# Patient Record
Sex: Female | Born: 1980 | Race: White | Hispanic: No | Marital: Married | Smoking: Never smoker
Health system: Southern US, Community
[De-identification: ages and names within clinical notes are randomized; demographics above are authoritative.]

## PROBLEM LIST (undated history)

## (undated) DIAGNOSIS — R112 Nausea with vomiting, unspecified: Secondary | ICD-10-CM

## (undated) DIAGNOSIS — N390 Urinary tract infection, site not specified: Secondary | ICD-10-CM

## (undated) DIAGNOSIS — F419 Anxiety disorder, unspecified: Secondary | ICD-10-CM

## (undated) DIAGNOSIS — M1990 Unspecified osteoarthritis, unspecified site: Secondary | ICD-10-CM

## (undated) DIAGNOSIS — Z803 Family history of malignant neoplasm of breast: Secondary | ICD-10-CM

## (undated) DIAGNOSIS — T4145XA Adverse effect of unspecified anesthetic, initial encounter: Secondary | ICD-10-CM

## (undated) DIAGNOSIS — Z9889 Other specified postprocedural states: Secondary | ICD-10-CM

## (undated) DIAGNOSIS — T8859XA Other complications of anesthesia, initial encounter: Secondary | ICD-10-CM

## (undated) HISTORY — DX: Unspecified osteoarthritis, unspecified site: M19.90

## (undated) HISTORY — DX: Anxiety disorder, unspecified: F41.9

## (undated) HISTORY — DX: Family history of malignant neoplasm of breast: Z80.3

## (undated) HISTORY — DX: Urinary tract infection, site not specified: N39.0

---

## 2003-08-25 HISTORY — PX: TUBAL LIGATION: SHX77

## 2010-06-08 ENCOUNTER — Other Ambulatory Visit: Payer: Self-pay | Admitting: Internal Medicine

## 2010-06-08 ENCOUNTER — Ambulatory Visit: Payer: Self-pay

## 2013-01-28 ENCOUNTER — Ambulatory Visit: Payer: Self-pay | Admitting: General Practice

## 2013-02-18 ENCOUNTER — Encounter: Payer: Self-pay | Admitting: Podiatry

## 2013-02-18 ENCOUNTER — Ambulatory Visit (INDEPENDENT_AMBULATORY_CARE_PROVIDER_SITE_OTHER): Payer: BC Managed Care – PPO

## 2013-02-18 ENCOUNTER — Ambulatory Visit (INDEPENDENT_AMBULATORY_CARE_PROVIDER_SITE_OTHER): Payer: BC Managed Care – PPO | Admitting: Podiatry

## 2013-02-18 VITALS — BP 144/80 | HR 61 | Resp 16 | Ht 65.0 in | Wt 188.0 lb

## 2013-02-18 DIAGNOSIS — M722 Plantar fascial fibromatosis: Secondary | ICD-10-CM

## 2013-02-18 MED ORDER — METHYLPREDNISOLONE (PAK) 4 MG PO TABS: ORAL_TABLET | ORAL | Status: DC

## 2013-02-18 MED ORDER — DICLOFENAC SODIUM 75 MG PO TBEC: 75.0000 mg | DELAYED_RELEASE_TABLET | Freq: Two times a day (BID) | ORAL | Status: DC

## 2013-02-18 NOTE — Progress Notes (Signed)
   Subjective:    Patient ID: Abigail Montes, female    DOB: 28-Jun-1980, 33 y.o.   MRN: [id number]  HPI Comments: My right foot , ive been seeing my regular doctor for my heel, and its been real sore and achey its been going on for a year, the longer up on it the more it hurts, i tend to put all weight on left foot and that seems to get sore, but its mostly my right foot .  The doctor gave me a cortisone injection and that didn't seem to work at all  Foot Pain      Review of Systems  Genitourinary:       Uti   All other systems reviewed and are negative.       Objective:   Physical Exam I have reviewed her past history medications allergies surgeries social history. Vital signs are stable she is alert and oriented x3. Pulses are strongly palpable right foot. Deep tendon reflexes are intact bilateral. Muscle strength is 5 over 5 dorsiflexors plantar flexors inverters everters all intrinsic musculature is intact orthopedic evaluation demonstrates all joints distal to the ankle a full range of motion without crepitus. She has pain on palpation medial continued tubercle of the right heel. Cutaneous evaluation Mr. is supple while hydrated cutis no erythema edema cellulitis drainage or odor.        Assessment & Plan:  Assessment: Pain in limb with plantar fasciitis right x1 year.  Plan: Discussed etiology pathology conservative versus surgical therapies. Injected the right heel today with Kenalog and local anesthetic. Put her on a Sterapred Dosepak to be followed by Mobic. Plantar fascial strapping and a night splint were dispensed. Both oral and written home-going instructions stretching exercises ice therapy shoe gear modifications all was given today.

## 2013-02-18 NOTE — Patient Instructions (Signed)
Plantar Fasciitis (Heel Spur Syndrome) with Rehab The plantar fascia is a fibrous, ligament-like, soft-tissue structure that spans the bottom of the foot. Plantar fasciitis is a condition that causes pain in the foot due to inflammation of the tissue. SYMPTOMS   Pain and tenderness on the underneath side of the foot.  Pain that worsens with standing or walking. CAUSES  Plantar fasciitis is caused by irritation and injury to the plantar fascia on the underneath side of the foot. Common mechanisms of injury include:  Direct trauma to bottom of the foot.  Damage to a small nerve that runs under the foot where the main fascia attaches to the heel bone.  Stress placed on the plantar fascia due to bone spurs. RISK INCREASES WITH:   Activities that place stress on the plantar fascia (running, jumping, pivoting, or cutting).  Poor strength and flexibility.  Improperly fitted shoes.  Tight calf muscles.  Flat feet.  Failure to warm-up properly before activity.  Obesity. PREVENTION  Warm up and stretch properly before activity.  Allow for adequate recovery between workouts.  Maintain physical fitness:  Strength, flexibility, and endurance.  Cardiovascular fitness.  Maintain a health body weight.  Avoid stress on the plantar fascia.  Wear properly fitted shoes, including arch supports for individuals who have flat feet. PROGNOSIS  If treated properly, then the symptoms of plantar fasciitis usually resolve without surgery. However, occasionally surgery is necessary. RELATED COMPLICATIONS   Recurrent symptoms that may result in a chronic condition.  Problems of the lower back that are caused by compensating for the injury, such as limping.  Pain or weakness of the foot during push-off following surgery.  Chronic inflammation, scarring, and partial or complete fascia tear, occurring more often from repeated injections. TREATMENT  Treatment initially involves the use of  ice and medication to help reduce pain and inflammation. The use of strengthening and stretching exercises may help reduce pain with activity, especially stretches of the Achilles tendon. These exercises may be performed at home or with a therapist. Your caregiver may recommend that you use heel cups of arch supports to help reduce stress on the plantar fascia. Occasionally, corticosteroid injections are given to reduce inflammation. If symptoms persist for greater than 6 months despite non-surgical (conservative), then surgery may be recommended.  MEDICATION   If pain medication is necessary, then nonsteroidal anti-inflammatory medications, such as aspirin and ibuprofen, or other minor pain relievers, such as acetaminophen, are often recommended.  Do not take pain medication within 7 days before surgery.  Prescription pain relievers may be given if deemed necessary by your caregiver. Use only as directed and only as much as you need.  Corticosteroid injections may be given by your caregiver. These injections should be reserved for the most serious cases, because they may only be given a certain number of times. HEAT AND COLD  Cold treatment (icing) relieves pain and reduces inflammation. Cold treatment should be applied for 10 to 15 minutes every 2 to 3 hours for inflammation and pain and immediately after any activity that aggravates your symptoms. Use ice packs or massage the area with a piece of ice (ice massage).  Heat treatment may be used prior to performing the stretching and strengthening activities prescribed by your caregiver, physical therapist, or athletic trainer. Use a heat pack or soak the injury in warm water. SEEK IMMEDIATE MEDICAL CARE IF:  Treatment seems to offer no benefit, or the condition worsens.  Any medications produce adverse side effects. EXERCISES RANGE   OF MOTION (ROM) AND STRETCHING EXERCISES - Plantar Fasciitis (Heel Spur Syndrome) These exercises may help you  when beginning to rehabilitate your injury. Your symptoms may resolve with or without further involvement from your physician, physical therapist or athletic trainer. While completing these exercises, remember:   Restoring tissue flexibility helps normal motion to return to the joints. This allows healthier, less painful movement and activity.  An effective stretch should be held for at least 30 seconds.  A stretch should never be painful. You should only feel a gentle lengthening or release in the stretched tissue. RANGE OF MOTION - Toe Extension, Flexion  Sit with your right / left leg crossed over your opposite knee.  Grasp your toes and gently pull them back toward the top of your foot. You should feel a stretch on the bottom of your toes and/or foot.  Hold this stretch for __________ seconds.  Now, gently pull your toes toward the bottom of your foot. You should feel a stretch on the top of your toes and or foot.  Hold this stretch for __________ seconds. Repeat __________ times. Complete this stretch __________ times per day.  RANGE OF MOTION - Ankle Dorsiflexion, Active Assisted  Remove shoes and sit on a chair that is preferably not on a carpeted surface.  Place right / left foot under knee. Extend your opposite leg for support.  Keeping your heel down, slide your right / left foot back toward the chair until you feel a stretch at your ankle or calf. If you do not feel a stretch, slide your bottom forward to the edge of the chair, while still keeping your heel down.  Hold this stretch for __________ seconds. Repeat __________ times. Complete this stretch __________ times per day.  STRETCH  Gastroc, Standing  Place hands on wall.  Extend right / left leg, keeping the front knee somewhat bent.  Slightly point your toes inward on your back foot.  Keeping your right / left heel on the floor and your knee straight, shift your weight toward the wall, not allowing your back to  arch.  You should feel a gentle stretch in the right / left calf. Hold this position for __________ seconds. Repeat __________ times. Complete this stretch __________ times per day. STRETCH  Soleus, Standing  Place hands on wall.  Extend right / left leg, keeping the other knee somewhat bent.  Slightly point your toes inward on your back foot.  Keep your right / left heel on the floor, bend your back knee, and slightly shift your weight over the back leg so that you feel a gentle stretch deep in your back calf.  Hold this position for __________ seconds. Repeat __________ times. Complete this stretch __________ times per day. STRETCH  Gastrocsoleus, Standing  Note: This exercise can place a lot of stress on your foot and ankle. Please complete this exercise only if specifically instructed by your caregiver.   Place the ball of your right / left foot on a step, keeping your other foot firmly on the same step.  Hold on to the wall or a rail for balance.  Slowly lift your other foot, allowing your body weight to press your heel down over the edge of the step.  You should feel a stretch in your right / left calf.  Hold this position for __________ seconds.  Repeat this exercise with a slight bend in your right / left knee. Repeat __________ times. Complete this stretch __________ times per day.    STRENGTHENING EXERCISES - Plantar Fasciitis (Heel Spur Syndrome)  These exercises may help you when beginning to rehabilitate your injury. They may resolve your symptoms with or without further involvement from your physician, physical therapist or athletic trainer. While completing these exercises, remember:   Muscles can gain both the endurance and the strength needed for everyday activities through controlled exercises.  Complete these exercises as instructed by your physician, physical therapist or athletic trainer. Progress the resistance and repetitions only as guided. STRENGTH - Towel  Curls  Sit in a chair positioned on a non-carpeted surface.  Place your foot on a towel, keeping your heel on the floor.  Pull the towel toward your heel by only curling your toes. Keep your heel on the floor.  If instructed by your physician, physical therapist or athletic trainer, add ____________________ at the end of the towel. Repeat __________ times. Complete this exercise __________ times per day. STRENGTH - Ankle Inversion  Secure one end of a rubber exercise band/tubing to a fixed object (table, pole). Loop the other end around your foot just before your toes.  Place your fists between your knees. This will focus your strengthening at your ankle.  Slowly, pull your big toe up and in, making sure the band/tubing is positioned to resist the entire motion.  Hold this position for __________ seconds.  Have your muscles resist the band/tubing as it slowly pulls your foot back to the starting position. Repeat __________ times. Complete this exercises __________ times per day.  Document Released: 01/10/2005 Document Revised: 04/04/2011 Document Reviewed: 04/24/2008 [employer] Patient Information 2014 [employer]. Plantar Fasciitis Plantar fasciitis is a common condition that causes foot pain. It is soreness (inflammation) of the band of tough fibrous tissue on the bottom of the foot that runs from the heel bone (calcaneus) to the ball of the foot. The cause of this soreness may be from excessive standing, poor fitting shoes, running on hard surfaces, being overweight, having an abnormal walk, or overuse (this is common in runners) of the painful foot or feet. It is also common in aerobic exercise dancers and ballet dancers. SYMPTOMS  Most people with plantar fasciitis complain of:  Severe pain in the morning on the bottom of their foot especially when taking the first steps out of bed. This pain recedes after a few minutes of walking.  Severe pain is experienced also during walking  following a long period of inactivity.  Pain is worse when walking barefoot or up stairs DIAGNOSIS   Your caregiver will diagnose this condition by examining and feeling your foot.  Special tests such as X-rays of your foot, are usually not needed. PREVENTION   Consult a sports medicine professional before beginning a new exercise program.  Walking programs offer a good workout. With walking there is a lower chance of overuse injuries common to runners. There is less impact and less jarring of the joints.  Begin all new exercise programs slowly. If problems or pain develop, decrease the amount of time or distance until you are at a comfortable level.  Wear good shoes and replace them regularly.  Stretch your foot and the heel cords at the back of the ankle (Achilles tendon) both before and after exercise.  Run or exercise on even surfaces that are not hard. For example, asphalt is better than pavement.  Do not run barefoot on hard surfaces.  If using a treadmill, vary the incline.  Do not continue to workout if you have foot or joint   problems. Seek professional help if they do not improve. HOME CARE INSTRUCTIONS   Avoid activities that cause you pain until you recover.  Use ice or cold packs on the problem or painful areas after working out.  Only take over-the-counter or prescription medicines for pain, discomfort, or fever as directed by your caregiver.  Soft shoe inserts or athletic shoes with air or gel sole cushions may be helpful.  If problems continue or become more severe, consult a sports medicine caregiver or your own health care provider. Cortisone is a potent anti-inflammatory medication that may be injected into the painful area. You can discuss this treatment with your caregiver. MAKE SURE YOU:   Understand these instructions.  Will watch your condition.  Will get help right away if you are not doing well or get worse. Document Released: 10/05/2000 Document  Revised: 04/04/2011 Document Reviewed: 12/05/2007 [employer] Patient Information 2014 [employer].  

## 2013-03-18 ENCOUNTER — Ambulatory Visit (INDEPENDENT_AMBULATORY_CARE_PROVIDER_SITE_OTHER): Payer: BC Managed Care – PPO | Admitting: Podiatry

## 2013-03-18 ENCOUNTER — Encounter: Payer: Self-pay | Admitting: Podiatry

## 2013-03-18 VITALS — BP 142/80 | HR 62 | Resp 16 | Ht 65.0 in | Wt 185.0 lb

## 2013-03-18 DIAGNOSIS — M722 Plantar fascial fibromatosis: Secondary | ICD-10-CM

## 2013-03-18 NOTE — Progress Notes (Signed)
She presents today for followup of her plantar fasciitis of the right foot. She states that she continues all conservative therapies including anti-inflammatory drugs night splints use of tennis shoes and icing. She states that over the past week or so her foot has really started to bother her once again.  Objective: Vital signs are stable she is alert and oriented x3. She has pain on palpation medial calcaneal tubercle the right heel. Pulses are palpable and there is no calf pain.  Assessment: Plantar fasciitis right.  Plan: Injection right heel again today Kenalog and local anesthetic and plantar fascial strapping was applied she will continue all conservative therapies and we will consider orthotics next visit.

## 2013-04-15 ENCOUNTER — Ambulatory Visit (INDEPENDENT_AMBULATORY_CARE_PROVIDER_SITE_OTHER): Payer: BC Managed Care – PPO | Admitting: Podiatry

## 2013-04-15 VITALS — BP 121/72 | HR 65 | Resp 16 | Ht 65.0 in | Wt 180.0 lb

## 2013-04-15 DIAGNOSIS — M722 Plantar fascial fibromatosis: Secondary | ICD-10-CM

## 2013-04-15 NOTE — Progress Notes (Signed)
She presents today states that her plantar fasciitis is starting to become painful once again and she's going to have to do something more permanent she says.  Objective: Vital signs are stable she is alert and oriented x3. Pulses are palpable right. Pain on palpation medial continued tubercle of the right heel.  Assessment: Plantar fasciitis chronic in nature right.  Plan: Continue conservative therapies. We scanned her today for orthotics. She was provided with her final injection to her right heel today. I will followup with her once her orthotics committed.

## 2013-04-24 ENCOUNTER — Encounter: Payer: Self-pay | Admitting: *Deleted

## 2013-04-24 NOTE — Progress Notes (Signed)
Sent pt post card letting her know orthotics are here. 

## 2013-07-22 ENCOUNTER — Encounter: Payer: Self-pay | Admitting: Podiatry

## 2013-07-22 ENCOUNTER — Ambulatory Visit (INDEPENDENT_AMBULATORY_CARE_PROVIDER_SITE_OTHER): Payer: BC Managed Care – PPO

## 2013-07-22 ENCOUNTER — Ambulatory Visit (INDEPENDENT_AMBULATORY_CARE_PROVIDER_SITE_OTHER): Payer: BC Managed Care – PPO | Admitting: Podiatry

## 2013-07-22 DIAGNOSIS — M7660 Achilles tendinitis, unspecified leg: Secondary | ICD-10-CM

## 2013-07-22 DIAGNOSIS — M7661 Achilles tendinitis, right leg: Secondary | ICD-10-CM

## 2013-07-22 DIAGNOSIS — M2420 Disorder of ligament, unspecified site: Secondary | ICD-10-CM

## 2013-07-22 DIAGNOSIS — M629 Disorder of muscle, unspecified: Secondary | ICD-10-CM

## 2013-07-22 MED ORDER — OXYCODONE-ACETAMINOPHEN 10-325 MG PO TABS: 1.0000 | ORAL_TABLET | ORAL | Status: DC | PRN

## 2013-07-22 NOTE — Progress Notes (Signed)
She presents today after feeling something rip yesterday while she was playing softball. She states she can walk on the foot anymore and it just hurts and throbs constantly she states that hurts in the tip of the toes to the posterior heel and the back of the leg and along the medial side of the foot. She denies any other trauma to the foot. She states it is swollen however it is not black and blue.  Objective: Vital signs are stable she is alert and oriented x3. She presents today with her husband utilizing crutches nonweightbearing status. Pulses to the right foot demonstrates a normal pulse as compared to the contralateral foot. Both feet are cool to the touch there is mild swelling about the right foot but not terrible. She has pain on palpation to the calcaneus the plantar fascia the posterior tibial tendon. She also has tenderness on palpation of her tendo Achilles. Radiographic evaluation does not demonstrate any type of osseous abnormalities and muscle strength to all of the tendons demonstrate the tendons are intact. I do believe that she is ruptured plantar fascia and sprained her foot.  Assessment: Probable rupture of the plantar fascia and possible tendinitis and sprain of her right foot.  Plan: Discussed etiology pathology for surgical therapies but a compression anklet and put her in a Cam Walker she will remain nonweightbearing for the next 2 weeks I wrote a prescription for Percocet for pain pills and she also will take an anti-inflammatory. Should she have questions insertion of immediately area and within 2 weeks if she's not feeling significantly better and MRI will be necessary to

## 2013-07-22 NOTE — Patient Instructions (Signed)

## 2013-08-05 ENCOUNTER — Ambulatory Visit (INDEPENDENT_AMBULATORY_CARE_PROVIDER_SITE_OTHER): Payer: BC Managed Care – PPO | Admitting: Podiatry

## 2013-08-05 ENCOUNTER — Encounter: Payer: Self-pay | Admitting: Podiatry

## 2013-08-05 VITALS — BP 154/84 | HR 62 | Resp 12

## 2013-08-05 DIAGNOSIS — M722 Plantar fascial fibromatosis: Secondary | ICD-10-CM

## 2013-08-05 DIAGNOSIS — M629 Disorder of muscle, unspecified: Secondary | ICD-10-CM

## 2013-08-05 NOTE — Progress Notes (Signed)
She presents today 2 weeks after tearing the plantar fascia right heel. She states that she still cannot put her foot to the floor.  Objective: She presents today without her crutches but with her Cam Walker. She has pain on palpation medial calcaneal tubercle left heel.  Assessment: Partial tear plantar fascia.  Plan: MRI request.

## 2013-08-14 ENCOUNTER — Ambulatory Visit: Payer: Self-pay | Admitting: Podiatry

## 2013-08-19 ENCOUNTER — Encounter: Payer: Self-pay | Admitting: Podiatry

## 2013-08-21 ENCOUNTER — Ambulatory Visit (INDEPENDENT_AMBULATORY_CARE_PROVIDER_SITE_OTHER): Payer: BC Managed Care – PPO | Admitting: Podiatry

## 2013-08-21 VITALS — BP 149/84 | HR 62 | Resp 16

## 2013-08-21 DIAGNOSIS — M629 Disorder of muscle, unspecified: Secondary | ICD-10-CM

## 2013-08-21 NOTE — Progress Notes (Signed)
She presents today for a MRI results of her right foot pain. She states it is hurting worse now than it was previously. She continues to walk on utilizing her Cam Gilford Rile however she sleeps without the boot at night.  Objective: Vital signs are stable she is alert and oriented x3. Pulses are palpable right lower extremity. No calf pain. She has pain on palpation medial calcaneal tubercle and central plantar tubercle.  Assessment: Tear of the plantar fascial her MRI right foot.  Plan: We went over consent form today line bylined number by number giving her time to ask questions she saw fit regarding a complete fasciotomy to the plantar aspect of the right foot. I answered all the questions regarding this procedure to the best of my ability in layman's terms. She understands a possible postop complications which may include but are not limited to postop pain bleeding swelling infection need further surgery continued pain and flattening of the foot. We will also perform a application of a below-knee cast for the right lower extremity. She will be nonweightbearing for at least 2 weeks.  She signed all 3 pages of the consent form I will followup with her in 2 weeks for surgery.

## 2013-08-24 HISTORY — PX: FOOT TENDON SURGERY: SHX958

## 2013-08-29 ENCOUNTER — Other Ambulatory Visit: Payer: Self-pay | Admitting: Podiatry

## 2013-08-29 MED ORDER — OXYCODONE-ACETAMINOPHEN 10-325 MG PO TABS: ORAL_TABLET | ORAL | Status: DC

## 2013-08-29 MED ORDER — CEPHALEXIN 500 MG PO CAPS: 500.0000 mg | ORAL_CAPSULE | Freq: Three times a day (TID) | ORAL | Status: DC

## 2013-08-29 MED ORDER — PROMETHAZINE HCL 25 MG PO TABS: 25.0000 mg | ORAL_TABLET | Freq: Three times a day (TID) | ORAL | Status: DC | PRN

## 2013-08-30 ENCOUNTER — Encounter: Payer: Self-pay | Admitting: Podiatry

## 2013-08-30 DIAGNOSIS — M722 Plantar fascial fibromatosis: Secondary | ICD-10-CM

## 2013-09-02 NOTE — Progress Notes (Signed)
Complete endoscopic plantar fasciotomy rt foot with application below knee cast.   Dos 8.7.15

## 2013-09-03 ENCOUNTER — Telehealth: Payer: Self-pay

## 2013-09-03 NOTE — Telephone Encounter (Signed)
Left message for pt to call back with questions or concerns

## 2013-09-05 ENCOUNTER — Ambulatory Visit (INDEPENDENT_AMBULATORY_CARE_PROVIDER_SITE_OTHER): Payer: BC Managed Care – PPO | Admitting: Podiatry

## 2013-09-05 ENCOUNTER — Telehealth: Payer: Self-pay | Admitting: *Deleted

## 2013-09-05 ENCOUNTER — Encounter: Payer: Self-pay | Admitting: Podiatry

## 2013-09-05 VITALS — BP 140/88 | HR 80 | Temp 99.4°F | Resp 16

## 2013-09-05 DIAGNOSIS — Z9889 Other specified postprocedural states: Secondary | ICD-10-CM

## 2013-09-05 NOTE — Telephone Encounter (Signed)
Pt called and left message stating that she had some questions , called her back and left her a message to call us back

## 2013-09-05 NOTE — Telephone Encounter (Signed)
Called patient to let her know that her test came back negative , that she is to start taking a baby aspirin daily and also ice and elevation. Call us if needed sooner than her appointment , spoke with her husband patient was getting her hair done

## 2013-09-06 NOTE — Progress Notes (Signed)
Abigail Montes presents today one week status post complete transverse endoscopic fasciotomy of her right heel. She states that she had been doing fine until yesterday when her posterior calf and posterior medial knee started her so badly she could not lay the cast down on a chair or in the bed. She states that she's had no rest for the past day or so. When asked whether or not she is taking her aspirin she states that no one told her to take aspirin. When asked if she were applying her eyes to the posterior knee she states that she was not told that she had to apply any ice. She also states that she has been back to work though we did discuss this preoperatively and I suggested that she not work. She states that she's propping the foot up while at work. She states that we did not write her out of work.  Objective: Vital signs are stable she is alert and oriented x3. Upon entering the room the patient is in obvious distress and in pain. Holding the right foot and leg off of the examining chair. She has tears in her eyes. I evaluated the cast and it was not very tight around the top part of the leg. Nor was a tight around the toes. She states that she had no ability to move her toes and the cast felt tight. Once removed the cast was examined there is no blood and there were no open lesions in the foot or leg. The dressing was removed again no bleeding. The fasciotomy sites were covered with a Band-Aid. Patient has pain on palpation to the calf and a positive Homans sign. There is one area that is warm to the touch but no significant edema.  Assessment: Endoscopic plantar fasciotomy right foot. Questionable DVT right calf.  Plan: Discussed etiology pathology conservative versus surgical therapies. She was sent to vascular lab immediately for a venous duplex/ultrasound. She was placed in her Cam Gilford Rile are to leaving and will continue the use of her crutches.  Prior to the end of the day we received a phone call and  a fax from the vascular lab stating that there were no blood clots identified. I did direct her prior to her leaving our office to start on 325 mg of aspirin daily and start icing the foot and calf. Should her cath continue to hurt warm compresses would be best.  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 We gave him the message to have her call us when she returns and she was provided the results of her tests by our assistant put our call was returned. I will followup with her in one week sutures will be removed at that time. She will be allowed to walk with her Cam Walker and without her crutches. A compression anklet will be dispensed as well.

## 2013-09-12 ENCOUNTER — Encounter: Payer: BC Managed Care – PPO | Admitting: Podiatry

## 2013-09-12 ENCOUNTER — Ambulatory Visit (INDEPENDENT_AMBULATORY_CARE_PROVIDER_SITE_OTHER): Payer: BC Managed Care – PPO | Admitting: Podiatry

## 2013-09-12 VITALS — BP 128/84 | HR 68 | Resp 16

## 2013-09-12 DIAGNOSIS — Z9889 Other specified postprocedural states: Secondary | ICD-10-CM

## 2013-09-12 NOTE — Progress Notes (Signed)
Subjective:     Patient ID: Abigail Montes, female   DOB: 1980-03-18, 33 y.o.   MRN: [id number]  HPI 33 year old female presents the office today status post complete transverse endoscopic fasciotomy on the right heel. She states that her pain has decreased however she continues to have slight discomfort in her leg. She continues to be nonweightbearing in CAM boot. She continues with ice/elevation. No other complaints at this time.  Review of Systems  All other systems reviewed and are negative.      Objective:   Physical Exam AAO x3, NAD DP/PT pulses palpable b/l 2/4. CRT < 3 sec.  Protective sensation intact with a Semmes Weinstein monofilament Incisions on both the medial and lateral aspect of the heel well coapted without any evidence of dehiscence or any clinical signs of infection   Mild tenderness over the leg however this is decreased since last appointment. No areas of warmth and no significant edema to the leg. No pain with compression.  Assessment:     33 year old female status post complete endoscopic plantar fasciotomy right foot    Plan:     Sutures are removed without complications. Band-Aid was applied over the incisions. Previous Doppler study was negative for DVT. At this time she is able to transition to weightbearing as tolerated in the CAM boot. Dispensed compression anklet Continue with ice and elevation Followup with Dr. Milinda Pointer in 2 weeks or sooner any problems are to arise

## 2013-09-26 ENCOUNTER — Ambulatory Visit (INDEPENDENT_AMBULATORY_CARE_PROVIDER_SITE_OTHER): Payer: BC Managed Care – PPO | Admitting: Podiatry

## 2013-09-26 VITALS — BP 150/93 | HR 68 | Resp 16

## 2013-09-26 DIAGNOSIS — Z9889 Other specified postprocedural states: Secondary | ICD-10-CM

## 2013-09-26 NOTE — Progress Notes (Signed)
She presents today for followup complete transverse EPF right foot. She states it really doesn't feel any better than it did prior to surgery.  Objective: Vital signs are stable she is alert and oriented x3. She is hypersensitive around the incision site of the right foot. There is no erythema edema saline is drainage or odor there is no pain in the calf there is no swelling or signs of inflammation or infection to the foot.  Assessment: Well-healing surgical foot which is still painful for the patient however that she go on to subside.  Plan: Massage therapy and continue use of the Cam Walker I will allow her out of the Cam Walker next visit.

## 2013-10-10 ENCOUNTER — Encounter: Payer: Self-pay | Admitting: Podiatry

## 2013-10-10 ENCOUNTER — Ambulatory Visit (INDEPENDENT_AMBULATORY_CARE_PROVIDER_SITE_OTHER): Payer: BC Managed Care – PPO | Admitting: Podiatry

## 2013-10-10 VITALS — BP 123/81 | HR 80 | Resp 16

## 2013-10-10 DIAGNOSIS — M629 Disorder of muscle, unspecified: Secondary | ICD-10-CM

## 2013-10-10 DIAGNOSIS — Z9889 Other specified postprocedural states: Secondary | ICD-10-CM

## 2013-10-10 NOTE — Progress Notes (Signed)
She presents today states that she seems to be getting better and she refers to the total transverse EPF it was performed. She still has minimal tenderness on ambulation in her Cam Walker.  Objective: Vital signs are stable she is alert and oriented x3. She has minimal tenderness on palpation of the medial calcaneal tubercle and the plantar medial longitudinal arch. There is no erythema edema saline is drainage or odor.  Assessment: Well-healing surgical foot right.  Plan: Lower walking with tennis shoes. She will continue use of the Cam Walker as needed. She will continue use of the night splint. I will followup with her in approximately 4 weeks

## 2013-11-04 ENCOUNTER — Ambulatory Visit (INDEPENDENT_AMBULATORY_CARE_PROVIDER_SITE_OTHER): Payer: BC Managed Care – PPO | Admitting: Podiatry

## 2013-11-04 DIAGNOSIS — Z9889 Other specified postprocedural states: Secondary | ICD-10-CM

## 2013-11-04 NOTE — Progress Notes (Signed)
She presents today for followup of her endoscopic plantar fasciotomy right foot. States that she is getting crazy spasms on the plantar aspect of the foot but she is still approximately 60-65% improved.  Objective: Vital signs are stable she is alert and oriented x3. She has mild tenderness on palpation of the deep intrinsic musculature but less pain on palpation of the medial calcaneal tubercle area.  Assessment: Well-healing EPF right foot.  Plan: Followup with her in approximately 6 weeks.

## 2013-12-16 ENCOUNTER — Encounter: Payer: BC Managed Care – PPO | Admitting: Podiatry

## 2014-01-24 HISTORY — PX: BREAST CYST ASPIRATION: SHX578

## 2014-01-27 ENCOUNTER — Telehealth: Payer: Self-pay | Admitting: *Deleted

## 2014-01-27 NOTE — Telephone Encounter (Signed)
Pt stated her surgery foot was burning, swelling, aching, and having pain over entire foot. Stated she can not walk on her foot at all. Told pt to stay off of foot, elevate, ice, wear boot, and take pain medication that she usually takes for headaches. Also made pt appt for 1.6.16 to be seen by dr Milinda Pointer. Pt stated she would do all of this but could not wear her boot because several straps were broken and the boot will not stay on.

## 2014-01-29 ENCOUNTER — Ambulatory Visit: Payer: Self-pay | Admitting: Podiatry

## 2014-02-03 ENCOUNTER — Ambulatory Visit: Payer: BC Managed Care – PPO | Admitting: Podiatry

## 2014-02-03 ENCOUNTER — Ambulatory Visit (INDEPENDENT_AMBULATORY_CARE_PROVIDER_SITE_OTHER): Payer: BLUE CROSS/BLUE SHIELD | Admitting: Podiatry

## 2014-02-03 VITALS — BP 140/80 | HR 70 | Resp 16

## 2014-02-03 DIAGNOSIS — M722 Plantar fascial fibromatosis: Secondary | ICD-10-CM

## 2014-02-03 MED ORDER — METHYLPREDNISOLONE (PAK) 4 MG PO TABS: ORAL_TABLET | ORAL | Status: DC

## 2014-02-03 NOTE — Progress Notes (Signed)
She presents today complaining of pain to her right foot. She states that he was doing very well but over the holidays she overdid it causing pain to start up in her foot once again.  Objective: Status post EPF 09/09/2013 right foot. She has pain on palpation may continue tubercle right heel.  Assessment: Chronic intractable plantar fasciitis status post EPF possibly associated with more of a myositis or inflammation muscles at its insertion site on the calcaneus.  Plan: Injected this area today with dexamethasone and local anesthetic started her on a Sterapred dose pack and I will follow-up with her in about 6 weeks

## 2014-02-24 ENCOUNTER — Ambulatory Visit: Payer: Self-pay | Admitting: Podiatry

## 2014-04-01 ENCOUNTER — Ambulatory Visit: Payer: Self-pay | Admitting: General Practice

## 2014-05-28 ENCOUNTER — Ambulatory Visit (INDEPENDENT_AMBULATORY_CARE_PROVIDER_SITE_OTHER): Payer: BLUE CROSS/BLUE SHIELD | Admitting: Podiatry

## 2014-05-28 ENCOUNTER — Ambulatory Visit (INDEPENDENT_AMBULATORY_CARE_PROVIDER_SITE_OTHER): Payer: BLUE CROSS/BLUE SHIELD

## 2014-05-28 ENCOUNTER — Telehealth: Payer: Self-pay | Admitting: *Deleted

## 2014-05-28 ENCOUNTER — Encounter: Payer: Self-pay | Admitting: Podiatry

## 2014-05-28 VITALS — BP 139/89 | HR 66 | Resp 16

## 2014-05-28 DIAGNOSIS — M722 Plantar fascial fibromatosis: Secondary | ICD-10-CM | POA: Diagnosis not present

## 2014-05-28 DIAGNOSIS — M778 Other enthesopathies, not elsewhere classified: Secondary | ICD-10-CM

## 2014-05-28 DIAGNOSIS — M7751 Other enthesopathy of right foot: Secondary | ICD-10-CM

## 2014-05-28 DIAGNOSIS — M779 Enthesopathy, unspecified: Secondary | ICD-10-CM

## 2014-05-28 MED ORDER — METHYLPREDNISOLONE 4 MG PO TBPK: ORAL_TABLET | ORAL | Status: DC

## 2014-05-28 NOTE — Progress Notes (Signed)
She presents today for follow-up of her painful feet bilateral. She has had an endoscopic plantar fasciotomy performed on the right foot which still resulted in pain to the lateral aspect of the foot right ovary here as she points to the fifth metatarsocuboid articulation area. She is also relates some heel pain left foot. She states that she is currently being treated with Cipro for chronic urinary tract infections. She states that on average she has bladder infections and kidney infections 6 times a year and she is always treated with Cipro or other quinolones.  Objective: Vital signs are stable she is alert and oriented 3. Pulses are strongly palpable bilateral. She has pain on palpation for fifth met cuboid articulation right foot. She also has pain on palpation medial tubercle of the left heel.  Assessment: Chronic intractable plantar fasciitis left. Lateral cuboid impingement syndrome right foot.  Plan: I encouraged her to try to find an alternative drug for urinary tract infections and to discuss this with her primary care provider. She has had plantar fasciitis and enthesopathy for over 2 years now I think she is probably having some type of reaction with quinolones and she is also having knee problems. I wrote a prescription for Medrol Dosepak to be taken after she finishes her antibiotics. I also suggested an injection to the joint space right foot and the plantar fascial area of the left foot. I will follow up with her in a month. 

## 2014-05-28 NOTE — Telephone Encounter (Addendum)
Pt states her right foot hurts worse after the injection today what does she need to do?  I called pt with Dr.Hyatt's recommendation and to rest, and ice 3 -4 times day for 10 to 15 minutes.

## 2014-05-28 NOTE — Telephone Encounter (Signed)
It takes a few days for the medication to work.  In a sore foot the injection itself can cause the area to become more tender.  Give it time.

## 2014-06-19 ENCOUNTER — Encounter: Payer: Self-pay | Admitting: *Deleted

## 2014-06-25 ENCOUNTER — Ambulatory Visit (INDEPENDENT_AMBULATORY_CARE_PROVIDER_SITE_OTHER): Payer: BLUE CROSS/BLUE SHIELD | Admitting: Podiatry

## 2014-06-25 ENCOUNTER — Encounter: Payer: Self-pay | Admitting: Podiatry

## 2014-06-25 VITALS — BP 131/85 | HR 65 | Resp 16

## 2014-06-25 DIAGNOSIS — M722 Plantar fascial fibromatosis: Secondary | ICD-10-CM | POA: Diagnosis not present

## 2014-06-25 NOTE — Progress Notes (Signed)
She presents today for follow-up of her neuritis and plantar fasciitis bilateral foot. She states that her right foot is doing much better her left still has some soreness. States that she recently purchased a new pair of new balance tennis shoes. She states that her orthotics really bother her when she wears them in her new shoes. She states that she feels like she is rolling to the outside.  She also relates that she is no longer taking Cipro which I was concerned because of an causing her enthesopathies.  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  Objective: Vital signs are stable she is alert and oriented 3. Pulses are strongly palpable. She has pain on palpation medial calcaneal tubercle and central plantar calcaneal tubercle left heel. Nothing on the right foot.  Assessment: Enthesopathies plantar fasciitis left.  Plan: We will send her orthotics back to the lab for lateral flange to be applied. I will follow-up with her once those come in. I reinjected her left heel today with Kenalog I only use half of the standard injection.

## 2014-06-30 ENCOUNTER — Ambulatory Visit (INDEPENDENT_AMBULATORY_CARE_PROVIDER_SITE_OTHER): Payer: BLUE CROSS/BLUE SHIELD | Admitting: General Surgery

## 2014-06-30 ENCOUNTER — Encounter: Payer: Self-pay | Admitting: General Surgery

## 2014-06-30 VITALS — BP 130/68 | HR 70 | Resp 14 | Ht 65.0 in | Wt 198.0 lb

## 2014-06-30 DIAGNOSIS — M940 Chondrocostal junction syndrome [Tietze]: Secondary | ICD-10-CM

## 2014-06-30 NOTE — Progress Notes (Signed)
Patient ID: Abigail Montes, female   DOB: 01-30-1980, 34 y.o.   MRN: [id number]  Chief Complaint  Patient presents with  . Other    breast lumps    HPI Abigail Montes is a 34 y.o. female here for evaluation of masses in both of her breasts. She first noticed some tenderness in the right breast about 3 weeks ago, she reports two areas that are sore to touch and reports a hard area in both sites. She noticed this when one of her dogs, a pedal walked across her chest. She started checking her other breast and has also noticed an area in the left breast that is tender with a hard area. She states that the areas are sometimes hard to find, but the tenderness is always present. She has had no imaging done on her breasts. She reports no previous breast problems. She has a maternal aunt that was diagnosed with breast cancer at age 77. That individual was not tested for BRCA by patient report.  Her mother is had fibroadenomas, cared for in this office.   She is currently on Doxycycline for treatment of tick fever.  HPI  Past Medical History  Diagnosis Date  . Arthritis     Past Surgical History  Procedure Laterality Date  . Foot tendon surgery  08/2013    torn tendon  . Tubal ligation  08/2003    Family History  Problem Relation Age of Onset  . Diabetes Father   . Heart attack Father     Social History History  Substance Use Topics  . Smoking status: Never Smoker   . Smokeless tobacco: Never Used  . Alcohol Use: No    Allergies  Allergen Reactions  . Latex Rash    Current Outpatient Prescriptions  Medication Sig Dispense Refill  . doxycycline (VIBRAMYCIN) 100 MG capsule Take 100 mg by mouth 2 (two) times daily.    Marland Kitchen ibuprofen (ADVIL,MOTRIN) 400 MG tablet Take 400 mg by mouth every 6 (six) hours as needed.     No current facility-administered medications for this visit.    Review of Systems Review of Systems  Constitutional: Negative.   Respiratory: Negative.    Cardiovascular: Negative.     Blood pressure 130/68, pulse 70, resp. rate 14, height _0  (1.651 m), weight 198 lb (89.812 kg), last menstrual period 06/27/2014.  Physical Exam Physical Exam  Constitutional: She is oriented to person, place, and time. She appears well-developed and well-nourished.  Eyes: Conjunctivae are normal. No scleral icterus.  Neck: Neck supple.  Cardiovascular: Normal rate, regular rhythm and normal heart sounds.   Pulmonary/Chest: Effort normal and breath sounds normal. Right breast exhibits tenderness (upper inner quadrant.). Right breast exhibits no inverted nipple, no mass, no nipple discharge and no skin change. Left breast exhibits tenderness (upper inner quadrant). Left breast exhibits no inverted nipple, no mass, no nipple discharge and no skin change.    Lymphadenopathy:    She has no cervical adenopathy.  Neurological: She is alert and oriented to person, place, and time.      Assessment    Musculoskeletal source for chest wall pain. No evidence of breast abnormality.    Plan    She has been asked to make use of a trial of Aleve, 2 tablets twice a day for 2 weeks is been recommended. She's been asked to give a phone follow-up at that time to report her progress. We'll arrange for baseline mammograms based on her age and family  history, although at this time I have no concerns for primary breast process.    Ref: Dr Rolinda Roan, [place] 07/01/2014, 7:43 AM

## 2014-07-01 ENCOUNTER — Telehealth: Payer: Self-pay

## 2014-07-01 ENCOUNTER — Other Ambulatory Visit: Payer: Self-pay

## 2014-07-01 DIAGNOSIS — Z803 Family history of malignant neoplasm of breast: Secondary | ICD-10-CM

## 2014-07-01 DIAGNOSIS — M940 Chondrocostal junction syndrome [Tietze]: Secondary | ICD-10-CM | POA: Insufficient documentation

## 2014-07-01 NOTE — Telephone Encounter (Signed)
Left message for patient to call back to discuss having a mammogram done at [facility]. She may need to call her insurance company to find out of they will cover her mammogram due to her age and based on her maternal aunt's diagnosis of breast cancer at age 36.

## 2014-07-01 NOTE — Telephone Encounter (Signed)
Spoke with patient about having a baseline mammogram done. She will call her insurance company to find out her coverage based on her age and family history and will call us back to let us know what they said.

## 2014-07-01 NOTE — Telephone Encounter (Signed)
-----   Message from Robert Bellow, MD sent at 07/01/2014  7:47 AM EDT ----- Please arrange for screening mammograms at [facility] in about 2 weeks.

## 2014-07-01 NOTE — Telephone Encounter (Signed)
Patient called back and said there were no age restrictions with her insurance company and they would pay for a base line screening mammogram. Patient is scheduled for a bilateral screening mammogram at [facility] for 08/08/14 at 9:00 am. Patient is aware of date and time.

## 2014-08-11 ENCOUNTER — Ambulatory Visit: Payer: BLUE CROSS/BLUE SHIELD | Admitting: Podiatry

## 2014-08-19 ENCOUNTER — Encounter: Payer: Self-pay | Admitting: General Surgery

## 2014-08-20 ENCOUNTER — Encounter: Payer: Self-pay | Admitting: General Surgery

## 2014-08-20 ENCOUNTER — Telehealth: Payer: Self-pay | Admitting: *Deleted

## 2014-08-20 ENCOUNTER — Ambulatory Visit (INDEPENDENT_AMBULATORY_CARE_PROVIDER_SITE_OTHER): Payer: BLUE CROSS/BLUE SHIELD | Admitting: Podiatry

## 2014-08-20 ENCOUNTER — Other Ambulatory Visit: Payer: BLUE CROSS/BLUE SHIELD

## 2014-08-20 ENCOUNTER — Encounter: Payer: Self-pay | Admitting: Podiatry

## 2014-08-20 ENCOUNTER — Ambulatory Visit (INDEPENDENT_AMBULATORY_CARE_PROVIDER_SITE_OTHER): Payer: BLUE CROSS/BLUE SHIELD | Admitting: General Surgery

## 2014-08-20 VITALS — BP 168/86 | HR 61 | Resp 16

## 2014-08-20 VITALS — BP 132/78 | HR 60 | Resp 12 | Ht 65.0 in | Wt 200.0 lb

## 2014-08-20 DIAGNOSIS — N63 Unspecified lump in breast: Secondary | ICD-10-CM

## 2014-08-20 DIAGNOSIS — M722 Plantar fascial fibromatosis: Secondary | ICD-10-CM

## 2014-08-20 DIAGNOSIS — N6320 Unspecified lump in the left breast, unspecified quadrant: Secondary | ICD-10-CM

## 2014-08-20 MED ORDER — GABAPENTIN 100 MG PO CAPS: 100.0000 mg | ORAL_CAPSULE | Freq: Every day | ORAL | Status: DC

## 2014-08-20 NOTE — Progress Notes (Signed)
Patient ID: Abigail Montes, female   DOB: [birth year], 34 y.o.   MRN: [id number]  Chief Complaint  Patient presents with  . Follow-up    HPI Abigail Montes is a 34 y.o. female.  Here today for follow up from her mammogram and ultrasound on 08-19-14. She states her left breast is tender since the mammogram.  The patient was seen in early July in regards to bilateral breast thickening and a screening mammogram was arranged. She is subsequently underwent additional views as well as an ultrasound raising a question of a mass in the left breast. She is seen for assessment. At the time of her clinical exam in July node areas of concern for malignancy were identified. She is accompanied today by her husband.   HPI  Past Medical History  Diagnosis Date  . Arthritis     Past Surgical History  Procedure Laterality Date  . Foot tendon surgery  08/2013    torn tendon  . Tubal ligation  08/2003    Family History  Problem Relation Age of Onset  . Diabetes Father   . Heart attack Father     Social History History  Substance Use Topics  . Smoking status: Never Smoker   . Smokeless tobacco: Never Used  . Alcohol Use: No    Allergies  Allergen Reactions  . Latex Rash    Current Outpatient Prescriptions  Medication Sig Dispense Refill  . gabapentin (NEURONTIN) 100 MG capsule Take 1 capsule (100 mg total) by mouth at bedtime. 30 capsule 3  . ibuprofen (ADVIL,MOTRIN) 400 MG tablet Take 400 mg by mouth every 6 (six) hours as needed.    . nitrofurantoin, macrocrystal-monohydrate, (MACROBID) 100 MG capsule TAKE ONE CAPSULE BY MOUTH TWICE A DAY WITH FOOD  0   No current facility-administered medications for this visit.    Review of Systems Review of Systems  Constitutional: Negative.   Respiratory: Negative.   Cardiovascular: Negative.     Blood pressure 132/78, pulse 60, resp. rate 12, height 5\' 5"  (1.651 m), weight 200 lb (90.719 kg), last menstrual period 07/25/2014.  Physical  Exam Physical Exam  Constitutional: She is oriented to person, place, and time. She appears well-developed and well-nourished.  HENT:  Mouth/Throat: Oropharynx is clear and moist.  Eyes: Conjunctivae are normal. No scleral icterus.  Pulmonary/Chest: Right breast exhibits no inverted nipple, no mass, no nipple discharge, no skin change and no tenderness. Left breast exhibits no inverted nipple, no mass, no nipple discharge, no skin change and no tenderness.    Lymphadenopathy:    She has no axillary adenopathy.  Neurological: She is alert and oriented to person, place, and time.  Skin: Skin is warm and dry.  Psychiatric: She has a normal mood and affect.    Data Reviewed July 2016 mammograms and ultrasound from [facility] reviewed. BI-RADS-4.  Ultrasound examination of the left breast in the 7:00 position, 3 cm from the nipple confirms a hypoechoic area without significant acoustic enhancement or shadowing measuring 0.4 x 0.8 x 1.2 cm. The patient was amenable to FNA sampling. This was completed using 1 mL of 1% plain Xylocaine. Multiple passes through the lesion were completed in slides 2 prepared for cytopathology. The procedure was well tolerated.  Assessment    Abnormal mammogram.    Plan    The patient will be contacted when pathology/pathology is available. Assuming it is benign, we'll plan for follow-up exam in September as previously scheduled in regards to her breast discomfort.  Robert Bellow 08/21/2014, 7:04 PM

## 2014-08-20 NOTE — Telephone Encounter (Signed)
-----   Message from Robert Bellow, MD sent at 08/20/2014  8:35 AM EDT ----- Please notify the patient that I reviewed her mammogram and ultrasound completed yesterday at [facility]. It looks like they scheduled her for a biopsy done at [facility]. If this is the case, tell her that she does not need to travel there she desires. Otherwise, arrange for early follow-up to review. Please notify her my suspicions for cancer are exceptionally low. ----- Message -----    From: Augustin Schooling, CMA    Sent: 08/19/2014   3:59 PM      To: Robert Bellow, MD

## 2014-08-20 NOTE — Telephone Encounter (Signed)
Message left for patient to call the office.

## 2014-08-20 NOTE — Progress Notes (Signed)
She presents today for follow-up of plantar fasciitis. She was dispensed her new orthotics today and at this point these orthotics are also incorrect. I expressed to her that I did not want to reinject her at this point today I will wait until her new orthotics come in.

## 2014-08-20 NOTE — Telephone Encounter (Signed)
Patient coming in for an appointment this afternoon.

## 2014-08-21 DIAGNOSIS — N6320 Unspecified lump in the left breast, unspecified quadrant: Secondary | ICD-10-CM | POA: Insufficient documentation

## 2014-09-30 ENCOUNTER — Ambulatory Visit (INDEPENDENT_AMBULATORY_CARE_PROVIDER_SITE_OTHER): Payer: BLUE CROSS/BLUE SHIELD | Admitting: General Surgery

## 2014-09-30 ENCOUNTER — Encounter: Payer: Self-pay | Admitting: General Surgery

## 2014-09-30 VITALS — BP 126/76 | HR 72 | Resp 16 | Ht 65.0 in | Wt 199.0 lb

## 2014-09-30 DIAGNOSIS — N63 Unspecified lump in unspecified breast: Secondary | ICD-10-CM

## 2014-09-30 DIAGNOSIS — M940 Chondrocostal junction syndrome [Tietze]: Secondary | ICD-10-CM | POA: Diagnosis not present

## 2014-09-30 NOTE — Progress Notes (Signed)
Patient ID: Abigail Montes, female   DOB: 11-19-1980, 34 y.o.   MRN: [id number]  Chief Complaint  Patient presents with  . Follow-up    Costochondritis    HPI Abigail Montes is a 34 y.o. female here today for a 3 month follow up for costochondritis on her left side. She states she is doing well, some tenderness. She notices it more after doing a lot of activity.  HPI  Past Medical History  Diagnosis Date  . Arthritis     Past Surgical History  Procedure Laterality Date  . Foot tendon surgery  08/2013    torn tendon  . Tubal ligation  08/2003  . Breast cyst aspiration Left 2016    Family History  Problem Relation Age of Onset  . Diabetes Father   . Heart attack Father     Social History Social History  Substance Use Topics  . Smoking status: Never Smoker   . Smokeless tobacco: Never Used  . Alcohol Use: No    Allergies  Allergen Reactions  . Latex Rash    Current Outpatient Prescriptions  Medication Sig Dispense Refill  . gabapentin (NEURONTIN) 100 MG capsule Take 1 capsule (100 mg total) by mouth at bedtime. 30 capsule 3  . ibuprofen (ADVIL,MOTRIN) 400 MG tablet Take 400 mg by mouth every 6 (six) hours as needed.     No current facility-administered medications for this visit.    Review of Systems Review of Systems  Constitutional: Negative.   Respiratory: Negative.   Cardiovascular: Negative.     Blood pressure 126/76, pulse 72, resp. rate 16, height 5\' 5"  (1.651 m), weight 199 lb (90.266 kg), last menstrual period 09/07/2014.  Physical Exam Physical Exam  Constitutional: She is oriented to person, place, and time. She appears well-developed and well-nourished.  Eyes: Conjunctivae are normal. No scleral icterus.  Cardiovascular: Normal rate, regular rhythm and normal heart sounds.   Pulmonary/Chest: Effort normal and breath sounds normal. Right breast exhibits no inverted nipple, no mass, no nipple discharge, no skin change and no tenderness. Left  breast exhibits no inverted nipple, no mass, no nipple discharge, no skin change and no tenderness.    No tenderness along the costochondral junction. No chest discomfort with lateral or AP compression.  Lymphadenopathy:    She has no cervical adenopathy.    She has no axillary adenopathy.  Neurological: She is alert and oriented to person, place, and time.  Skin: Skin is warm and dry.  Psychiatric: Her behavior is normal.    Data Reviewed 08/20/2014 FNA  Diagnosis RARE DUCTAL EPITHELIAL CELLS AND ABUNDANT BLOOD. NO MALIGNANT CELLS IDENTIFIED. Enid Cutter MD  Assessment    Benign breast exam, resolution of previous costochondritis.    Plan    The patient was noted to have chewed her fingernails, and when questioned she reported that the stress related to her chest and then the identification of the small 1 cm nodule in the left breast has been very stressful. We had a long discussion about options for management including continued planned observation versus vacuum excision of the lesion to remove any and all concerns. The patient will consider her options and notify the office of how she would like to proceed. At a minimum we'll plan on a follow-up examination in one year.     Plan to return in one year with screening mammogram in July 2017.  PCP:NO PCP    Robert Bellow 10/01/2014, 11:52 AM

## 2014-09-30 NOTE — Patient Instructions (Addendum)
Call back with any questions or concerns. Plan to return in one year with screening mammogram in July 2017.

## 2014-10-01 DIAGNOSIS — N63 Unspecified lump in unspecified breast: Secondary | ICD-10-CM | POA: Insufficient documentation

## 2014-10-08 ENCOUNTER — Ambulatory Visit (INDEPENDENT_AMBULATORY_CARE_PROVIDER_SITE_OTHER): Payer: BLUE CROSS/BLUE SHIELD | Admitting: Podiatry

## 2014-10-08 ENCOUNTER — Encounter: Payer: Self-pay | Admitting: Podiatry

## 2014-10-08 VITALS — BP 142/81 | HR 60 | Resp 18

## 2014-10-08 DIAGNOSIS — M722 Plantar fascial fibromatosis: Secondary | ICD-10-CM

## 2014-10-08 NOTE — Progress Notes (Signed)
She presents today for follow-up of her plantar fasciitis. She states the left heel seems to be worse and I think the insoles are aggravating it.  Objective: Vital signs are stable she is alert and oriented 3. She goes on to tell me that her right foot is doing much better and has very little pain on palpation of the medial calcaneal tubercle. This was the foot that had the endoscopic plantar fasciotomy performed several months ago now. Her left foot does demonstrate pain on palpation medial calcaneal tubercle of the left heel. Pulses remain palpable to this foot and there is minimal swelling no erythema edema saline as drainage or odor.  Assessment: Slowly resolving plantar fasciitis right heel. I plantar fasciitis to the left heel is also noted.  Plan: Discussed etiology pathology conservative versus surgical therapies. We discussed with her in great detail today the need for surgical intervention regarding her left foot. At this point with her husband having had surgery and having fasciitis she cannot have surgical intervention at this time. I reinjected her left heel today and she will continue the use of the orthotics. She doesn't want to stay for these orthotics are more comfortable than previous orthotics.

## 2014-11-05 ENCOUNTER — Ambulatory Visit: Payer: BLUE CROSS/BLUE SHIELD | Admitting: Podiatry

## 2014-11-10 ENCOUNTER — Ambulatory Visit: Payer: BLUE CROSS/BLUE SHIELD | Admitting: Podiatry

## 2014-12-12 ENCOUNTER — Encounter: Payer: Self-pay | Admitting: Sports Medicine

## 2014-12-12 ENCOUNTER — Telehealth: Payer: Self-pay | Admitting: *Deleted

## 2014-12-12 ENCOUNTER — Ambulatory Visit (INDEPENDENT_AMBULATORY_CARE_PROVIDER_SITE_OTHER): Payer: BLUE CROSS/BLUE SHIELD | Admitting: Sports Medicine

## 2014-12-12 DIAGNOSIS — M722 Plantar fascial fibromatosis: Secondary | ICD-10-CM

## 2014-12-12 DIAGNOSIS — Z9889 Other specified postprocedural states: Secondary | ICD-10-CM

## 2014-12-12 DIAGNOSIS — M79672 Pain in left foot: Secondary | ICD-10-CM | POA: Diagnosis not present

## 2014-12-12 DIAGNOSIS — M779 Enthesopathy, unspecified: Secondary | ICD-10-CM | POA: Diagnosis not present

## 2014-12-12 MED ORDER — GABAPENTIN 100 MG PO CAPS: 100.0000 mg | ORAL_CAPSULE | Freq: Every day | ORAL | Status: DC

## 2014-12-12 MED ORDER — DICLOFENAC SODIUM 75 MG PO TBEC: 75.0000 mg | DELAYED_RELEASE_TABLET | Freq: Two times a day (BID) | ORAL | Status: DC

## 2014-12-12 MED ORDER — TRIAMCINOLONE ACETONIDE 10 MG/ML IJ SUSP: 10.0000 mg | Freq: Once | INTRAMUSCULAR | Status: DC

## 2014-12-12 NOTE — Progress Notes (Signed)
Patient ID: Abigail Montes, female   DOB: 23-Mar-1980, 34 y.o.   MRN: [id number] Subjective: Abigail Montes is a 34 y.o. f/female patient presents to office with complaint of heel pain on the left>right. Patient had an EPF on right and reports that slowly it is feeling a little better however now the left heel has been hurting with the last injection to the area in September. Reports she has tried ice, heat, soaking, sleeping boot, orthotics, ibuprofen, steroid dose pack multiple times, mobic with no relief. Patient states that on the left pain is 6-7/10 with a new pain on the side of the foot that travel up the leg made worse with sitting and certain movements and better with standing. Patient denies any other pedal concerns.  Objective: Physical Exam General: The patient is alert and oriented x3 in no acute distress.  Dermatology: Skin is warm, dry and supple bilateral lower extremities. Nails 1-10 are normal. There is no erythema, edema, no eccymosis, no open lesions present. Integument is otherwise unremarkable.  Vascular: Dorsalis Pedis pulse and Posterior Tibial pulse are 2/4 bilateral. Capillary fill time is immediate to all digits.  Neurological: Grossly intact to light touch with an achilles reflex of +2/5 and a  negative Tinel's sign bilateral.  Musculoskeletal: Tenderness to palpation at the peroneal tendons on left. Tenderness to palpation to medial calcaneal tubercale and through the insertion of the plantar fascia on the left>right foot. No pain with compression of calcaneus bilateral. No pain with tuning fork to calcaneus bilateral. No pain with calf compression bilateral. All range of motion within normal limits bilateral except on left where there is some discomfort on inversion. No pain to ankle ligaments or instability. Strength 5/5 in all groups bilateral.   Gait: Unassisted, Antalgic avoid weight on left > right heel  Assessment and Plan: Problem List Items Addressed This  Visit    None    Visit Diagnoses    Plantar fasciitis of left foot    -  Primary    Relevant Medications    diclofenac (VOLTAREN) 75 MG EC tablet    Tendonitis        Peroneals on left    Relevant Medications    triamcinolone acetonide (KENALOG) 10 MG/ML injection 10 mg    diclofenac (VOLTAREN) 75 MG EC tablet    Status post right foot surgery        EPF    Left foot pain        Relevant Medications    gabapentin (NEURONTIN) 100 MG capsule    triamcinolone acetonide (KENALOG) 10 MG/ML injection 10 mg    diclofenac (VOLTAREN) 75 MG EC tablet       -Complete examination performed. Discussed with patient in detail the condition of plantar fasciitis on left with now compensation pain over lateral foot and ankle, how this occurs, and general treatment options. Explained both conservative and surgical treatments. Patient has tried multiple conservative treatments with no improvement. Encouraged patient to consider EPAT and or EPF. Patient states that she wants to wait to have surgery after the new year; Now is not a good time for her.  -After oral consent and aseptic prep, injected a mixture containing 1 ml of 2%  plain lidocaine, 1 ml 0.5% plain marcaine, 0.5 ml of kenalog 10 and 0.5 ml of dexamethasone phosphate along the left peroneal sheath and at area of most pain. Post-injection care discussed with patient.  -Recommend patient to use CAM walker on left for 1  week and then to transition to using fascial brace which was dispensed at today's visit with good supportive sneaker. Patient advised to strap fascial brace from medial to lateral to assist with taking the stress off of the lateral portion of the foot.  -Rx Diclofenac and refilled gabapentin.  -Explained to patient to continue with daily stretching exercises. -Recommend patient to ice affected area 1-2x daily. -Recommend to continue with night splint daily.  -Patient to return to office in 2 weeks for follow up with Dr. Milinda Pointer to  further discuss treatment options or sooner if problems or questions arise.  Landis Martins, DPM

## 2014-12-12 NOTE — Telephone Encounter (Signed)
Pt request rx be sent to [facility].

## 2014-12-16 ENCOUNTER — Ambulatory Visit: Payer: BLUE CROSS/BLUE SHIELD | Admitting: Sports Medicine

## 2014-12-31 ENCOUNTER — Ambulatory Visit (INDEPENDENT_AMBULATORY_CARE_PROVIDER_SITE_OTHER): Payer: BLUE CROSS/BLUE SHIELD | Admitting: Podiatry

## 2014-12-31 ENCOUNTER — Encounter: Payer: Self-pay | Admitting: Podiatry

## 2014-12-31 VITALS — BP 121/65 | HR 74 | Resp 18

## 2014-12-31 DIAGNOSIS — M722 Plantar fascial fibromatosis: Secondary | ICD-10-CM

## 2014-12-31 MED ORDER — METHYLPREDNISOLONE 4 MG PO TBPK: ORAL_TABLET | ORAL | Status: DC

## 2014-12-31 MED ORDER — GABAPENTIN 300 MG PO CAPS: 300.0000 mg | ORAL_CAPSULE | Freq: Every day | ORAL | Status: DC

## 2014-12-31 NOTE — Progress Notes (Signed)
She presents today for follow-up of pain to her left foot. She states that she has had pain along the lateral column that radiates up to the lateral aspect of her left knee. This is more than likely compensation secondary to plantar fasciitis of the left foot. Initially we had performed an endoscopic fasciotomy of the right foot which was initially getting well prior to the left foot becoming symptomatic once the left foot became symptomatic the right foot then become more symptomatic once again.  Objective: Vital signs are stable she is alert and oriented 3 she has pain on palpation medial calcaneal tubercle of the left heel as well as the peroneal tendons left.  Assessment: Chronic intractable plantar fasciitis many years left.  Plan: At this point I'm requesting an MRI of the left foot to evaluate for plantar fasciitis and a tear of the peroneal tendons. I will follow up with her once this comes in for separation of surgery prior to the end of the year.

## 2015-01-02 ENCOUNTER — Telehealth: Payer: Self-pay | Admitting: *Deleted

## 2015-01-02 NOTE — Telephone Encounter (Addendum)
Pt states she has not been called for MRI appt.  Informed pt we were waiting for the prior authorization.  [employer] prior authorization for MRI left foot [id number], Dx [id number], NO PRE-CERT IS REQUIRED, REFERENCE - 01/05/2015 VANESSA G. [id number]. Faxed to [facility].

## 2015-01-23 ENCOUNTER — Ambulatory Visit
Admission: RE | Admit: 2015-01-23 | Discharge: 2015-01-23 | Disposition: A | Discharge status: Home / Self Care | Payer: BLUE CROSS/BLUE SHIELD | Source: Ambulatory Visit | Attending: Podiatry | Admitting: Podiatry

## 2015-01-23 DIAGNOSIS — M722 Plantar fascial fibromatosis: Secondary | ICD-10-CM | POA: Diagnosis present

## 2015-01-25 HISTORY — PX: REDUCTION MAMMAPLASTY: SUR839

## 2015-01-25 HISTORY — PX: FOOT SURGERY: SHX648

## 2015-01-28 ENCOUNTER — Telehealth: Payer: Self-pay | Admitting: *Deleted

## 2015-01-28 NOTE — Telephone Encounter (Signed)
Pt request MRI results from 01/23/2015.

## 2015-01-29 NOTE — Telephone Encounter (Addendum)
-----   Message from Garrel Ridgel, [place] sent at 01/28/2015  6:29 PM EST ----- Plantar fasciitis is the MRI result.  She can  Fu for sx consult or PT rx.  Informed pt of Dr. Stephenie Acres recommendations, and pt states she would like more information on the surgery.  Pt transferred to schedulers.

## 2015-02-18 ENCOUNTER — Ambulatory Visit (INDEPENDENT_AMBULATORY_CARE_PROVIDER_SITE_OTHER): Payer: BLUE CROSS/BLUE SHIELD | Admitting: Podiatry

## 2015-02-18 ENCOUNTER — Encounter: Payer: Self-pay | Admitting: Podiatry

## 2015-02-18 VITALS — BP 142/84 | HR 74 | Resp 18

## 2015-02-18 DIAGNOSIS — M722 Plantar fascial fibromatosis: Secondary | ICD-10-CM

## 2015-02-18 NOTE — Patient Instructions (Signed)
Pre-Operative Instructions  Congratulations, you have decided to take an important step to improving your quality of life.  You can be assured that the doctors of [facility] will be with you every step of the way.  1. Plan to be at the surgery center/hospital at least 1 (one) hour prior to your scheduled time unless otherwise directed by the surgical center/hospital staff.  You must have a responsible adult accompany you, remain during the surgery and drive you home.  Make sure you have directions to the surgical center/hospital and know how to get there on time. 2. For hospital based surgery you will need to obtain a history and physical form from your family physician within 1 month prior to the date of surgery- we will give you a form for you primary physician.  3. We make every effort to accommodate the date you request for surgery.  There are however, times where surgery dates or times have to be moved.  We will contact you as soon as possible if a change in schedule is required.   4. No Aspirin/Ibuprofen for one week before surgery.  If you are on aspirin, any non-steroidal anti-inflammatory medications (Mobic, Aleve, Ibuprofen) you should stop taking it 7 days prior to your surgery.  You make take Tylenol  For pain prior to surgery.  5. Medications- If you are taking daily heart and blood pressure medications, seizure, reflux, allergy, asthma, anxiety, pain or diabetes medications, make sure the surgery center/hospital is aware before the day of surgery so they may notify you which medications to take or avoid the day of surgery. 6. No food or drink after midnight the night before surgery unless directed otherwise by surgical center/hospital staff. 7. No alcoholic beverages 24 hours prior to surgery.  No smoking 24 hours prior to or 24 hours after surgery. 8. Wear loose pants or shorts- loose enough to fit over bandages, boots, and casts. 9. No slip on shoes, sneakers are best. 10. Bring  your boot with you to the surgery center/hospital.  Also bring crutches or a walker if your physician has prescribed it for you.  If you do not have this equipment, it will be provided for you after surgery. 11. If you have not been contracted by the surgery center/hospital by the day before your surgery, call to confirm the date and time of your surgery. 12. Leave-time from work may vary depending on the type of surgery you have.  Appropriate arrangements should be made prior to surgery with your employer. 13. Prescriptions will be provided immediately following surgery by your doctor.  Have these filled as soon as possible after surgery and take the medication as directed. 14. Remove nail polish on the operative foot. 15. Wash the night before surgery.  The night before surgery wash the foot and leg well with the antibacterial soap provided and water paying special attention to beneath the toenails and in between the toes.  Rinse thoroughly with water and dry well with a towel.  Perform this wash unless told not to do so by your physician.  Enclosed: 1 Ice pack (please put in freezer the night before surgery)   1 Hibiclens skin cleaner   Pre-op Instructions  If you have any questions regarding the instructions, do not hesitate to call our office.  [place]: [address] [place], [place] [address] [phone]  [place]: [address] [place], [place] [address] [phone]  [place]: [address]  [place], [place] [address] [phone]  Dr. Richard   Tuchman DPM, Dr. Norman Regal DPM Dr. Richard Sikora DPM, Dr. M. Todd Hyatt DPM, Dr. Kathryn Egerton DPM 

## 2015-02-18 NOTE — Progress Notes (Signed)
She presents today for follow-up of her MRI of her left foot. He states that he still hurts really badly and the right one really doesn't bother her as much as it did.  Objective: Vital signs are stable she is alert and oriented 3. MRI states that there is a chronic proximal plantar fasciitis of the left heel. This is consistent with findings of pain on palpation medial continued tubercle of the left heel. No open lesions or wounds.  Assessment: Pain in limb secondary to chronic intractable plantar fasciitis of the left foot.  Plan: At this point we consented her today for an endoscopic plantar fasciotomy of the left foot. I answered all questions regarding this procedure to the best of my ability in layman's terms she understood was amenable to it and find all 3 pages of the consent form. We did discuss the possible postoperative locations which may include but are not limited to postop pain bleeding swelling infection recurrence need for further surgery loss of digit loss of limb loss of life. She signed all 3 pages of the consent form she will utilize the boot that she already has at home and I will follow-up with her the near future for surgery.

## 2015-02-19 ENCOUNTER — Telehealth: Payer: Self-pay | Admitting: *Deleted

## 2015-02-19 NOTE — Telephone Encounter (Signed)
"  I went yesterday and talked to Dr. Milinda Pointer.  He was going to get me scheduled for surgery.  He said I would hear something today.  Trying to see if anything was scheduled or if you need anything to get it scheduled."

## 2015-02-23 ENCOUNTER — Telehealth: Payer: Self-pay | Admitting: *Deleted

## 2015-02-23 NOTE — Telephone Encounter (Signed)
Pt states she has signed consent forms and would like to schedule.  I scheduled pt for 03/13/2015 with Dr. Milinda Pointer.

## 2015-02-24 NOTE — Telephone Encounter (Signed)
Marcy Siren gave patient a surgery date of 03/13/2015.

## 2015-03-12 ENCOUNTER — Other Ambulatory Visit: Payer: Self-pay | Admitting: Podiatry

## 2015-03-12 MED ORDER — OXYCODONE-ACETAMINOPHEN 10-325 MG PO TABS: ORAL_TABLET | ORAL | Status: DC

## 2015-03-12 MED ORDER — CEPHALEXIN 500 MG PO CAPS: 500.0000 mg | ORAL_CAPSULE | Freq: Three times a day (TID) | ORAL | Status: DC

## 2015-03-12 MED ORDER — PROMETHAZINE HCL 25 MG PO TABS: 25.0000 mg | ORAL_TABLET | Freq: Three times a day (TID) | ORAL | Status: DC | PRN

## 2015-03-13 ENCOUNTER — Encounter: Payer: Self-pay | Admitting: Podiatry

## 2015-03-13 DIAGNOSIS — M722 Plantar fascial fibromatosis: Secondary | ICD-10-CM | POA: Diagnosis not present

## 2015-03-16 ENCOUNTER — Telehealth: Payer: Self-pay | Admitting: *Deleted

## 2015-03-16 MED ORDER — ONDANSETRON HCL 4 MG PO TABS: 4.0000 mg | ORAL_TABLET | Freq: Four times a day (QID) | ORAL | Status: DC | PRN

## 2015-03-16 MED ORDER — HYDROMORPHONE HCL 2 MG PO TABS: 2.0000 mg | ORAL_TABLET | Freq: Four times a day (QID) | ORAL | Status: DC | PRN

## 2015-03-16 NOTE — Telephone Encounter (Signed)
Patient called back stating that her pain meds were making her sick and the phenergan knocks her out and she has to watch her kids. She has only been able to take Ibuprofen. Per Dr. Lavell Luster change meds to Dilaudid 2 mg q6h and Zofran. Will print both Rx for pt to pick up.

## 2015-03-16 NOTE — Telephone Encounter (Signed)
Received message that patient wanted a call back from the nurse.

## 2015-03-16 NOTE — Telephone Encounter (Signed)
I returned patient's call and there was no answer-LM to call back with questions

## 2015-03-18 ENCOUNTER — Ambulatory Visit (INDEPENDENT_AMBULATORY_CARE_PROVIDER_SITE_OTHER): Payer: BLUE CROSS/BLUE SHIELD | Admitting: Podiatry

## 2015-03-18 ENCOUNTER — Encounter: Payer: Self-pay | Admitting: Podiatry

## 2015-03-18 VITALS — BP 153/92 | HR 65 | Resp 18

## 2015-03-18 DIAGNOSIS — Z9889 Other specified postprocedural states: Secondary | ICD-10-CM

## 2015-03-18 DIAGNOSIS — M722 Plantar fascial fibromatosis: Secondary | ICD-10-CM

## 2015-03-18 NOTE — Progress Notes (Signed)
She presents today 1 week status post EPF left foot. She states that the incision sites are little bit sore but it seems to be doing better than the right foot did. She continues to wear her boot 24 hours a day.  Objective: Vital signs are stable she is alert and oriented 3 dry sterile dressing intact demonstrates no erythema edema cellulitis drainage or odor. She has no calf pain on palpation negative Homans sign.  Assessment: Well-healing EPF left.  Plan: Follow-up with her in 1 week for suture removal. Encouraged her to continue the use of her Cam Walker or night splint.

## 2015-03-20 ENCOUNTER — Telehealth: Payer: Self-pay | Admitting: Podiatry

## 2015-03-20 NOTE — Telephone Encounter (Signed)
Left message informing pt to go back in to the boot, and to make an appt to be seen Monday in [place]. I told her I would inform Dr. Milinda Pointer and if anything else was needed either he or I would call.

## 2015-03-20 NOTE — Telephone Encounter (Signed)
PT CALLED IN STATING HAD SURGERY LAST Friday 03/13/2015 AND DR. HYATT TOLD HER ON HER LAST APPT THAT HE WANTED HER TO START WALKING ON HER FOOT. PT SAYS THAT SINCE SHE HAS BEEN WALKING ON IT IT FEELS LIKE SOMETHING TOWARDS THE BACK OF HER HEEL IS RIPPING

## 2015-03-23 NOTE — Telephone Encounter (Signed)
Informed pt of Dr. Stephenie Acres statement and she states her heel burns terribly when she puts it down, and is swollen and bruised.  I told pt that as she becomes more active she may find she has more swelling for a while, to ice and elevate for comfort and symptoms.  Pt states she has an appt 03/25/2015.

## 2015-03-23 NOTE — Telephone Encounter (Signed)
OK This happens with EPF and no need to worry.  Continue to walk in boot.

## 2015-03-25 ENCOUNTER — Ambulatory Visit (INDEPENDENT_AMBULATORY_CARE_PROVIDER_SITE_OTHER): Payer: BLUE CROSS/BLUE SHIELD | Admitting: Podiatry

## 2015-03-25 ENCOUNTER — Encounter: Payer: Self-pay | Admitting: Podiatry

## 2015-03-25 DIAGNOSIS — Z9889 Other specified postprocedural states: Secondary | ICD-10-CM

## 2015-03-25 DIAGNOSIS — M722 Plantar fascial fibromatosis: Secondary | ICD-10-CM

## 2015-03-26 NOTE — Progress Notes (Signed)
She presents today 2 weeks status post endoscopic antral fasciotomy left foot. She states that when she tries to walk on the foot she gets a burning sensation as well as a tearing sensation.  Objective: She presents with her daughter today utilizing crutches and Cam Walker. The foot appears normal there is no ecchymosis sutures are intact margins well coapted we removed the sutures today margins remain well coapted she is healing well. There is no signs of the plantar fascia on palpation.  Assessment: Well-healing surgical foot.  Plan: I encouraged her to discontinue the use of the crutches and continue the use of the boot 24 hours a day. I will follow up with her in 2 weeks which time we have to be out of the boot.

## 2015-04-08 ENCOUNTER — Ambulatory Visit: Payer: BLUE CROSS/BLUE SHIELD | Admitting: Podiatry

## 2015-04-08 ENCOUNTER — Ambulatory Visit (INDEPENDENT_AMBULATORY_CARE_PROVIDER_SITE_OTHER): Payer: BLUE CROSS/BLUE SHIELD | Admitting: Podiatry

## 2015-04-08 ENCOUNTER — Encounter: Payer: Self-pay | Admitting: Podiatry

## 2015-04-08 VITALS — BP 144/79 | HR 70 | Resp 16

## 2015-04-08 DIAGNOSIS — M722 Plantar fascial fibromatosis: Secondary | ICD-10-CM

## 2015-04-08 DIAGNOSIS — Z9889 Other specified postprocedural states: Secondary | ICD-10-CM

## 2015-04-08 NOTE — Progress Notes (Signed)
She presents today for a follow-up of her endoscopic plantar fasciotomy left foot. She is approximately one month out now and is slowly resolving from a residual tear in the fascia. She continues to wear her cam walker but states that she feels much better.  Objective: Vital signs are stable alert and oriented 3. Pulses are palpable. She has much decrease in edema and much less pain on palpation medial calcaneal tubercle of the left heel.  Assessment: Well-healing surgical foot left slightly delayed.  Plan: I encouraged her to wear the boot for the next week and then alternate between the boot and Hirsch tennis shoe with her orthotic the following week I will follow-up with her in 2 weeks.

## 2015-05-11 ENCOUNTER — Encounter: Payer: Self-pay | Admitting: Podiatry

## 2015-05-11 ENCOUNTER — Ambulatory Visit (INDEPENDENT_AMBULATORY_CARE_PROVIDER_SITE_OTHER): Payer: BLUE CROSS/BLUE SHIELD | Admitting: Podiatry

## 2015-05-11 VITALS — BP 152/87 | HR 63 | Resp 16

## 2015-05-11 DIAGNOSIS — M722 Plantar fascial fibromatosis: Secondary | ICD-10-CM

## 2015-05-11 DIAGNOSIS — Z9889 Other specified postprocedural states: Secondary | ICD-10-CM

## 2015-05-11 MED ORDER — GABAPENTIN 300 MG PO CAPS: 300.0000 mg | ORAL_CAPSULE | Freq: Three times a day (TID) | ORAL | Status: DC

## 2015-05-11 NOTE — Progress Notes (Signed)
She presents today for follow-up of her endoscopic plantar fasciotomy left foot. She states that currently it is hurting worse than it has for many months including prior to surgery. She states that her right foot which was doing much better is also starting to hurt.  Objective: Vital signs are stable alert and oriented 3. Severe pain on palpation medial calcaneal tubercle of the left heel. Much worse than previously noted.  Assessment: Recalcitrant plantar fasciitis status post EPF left and now right.  Plan: At this point I will send her to physical therapy and increase her gabapentin 300 mg 3 times a day.. I encouraged that she follow-up with her primary care provider for blood work.

## 2015-05-28 ENCOUNTER — Encounter: Payer: Self-pay | Admitting: Podiatry

## 2015-06-06 ENCOUNTER — Other Ambulatory Visit: Payer: Self-pay | Admitting: Sports Medicine

## 2015-08-20 ENCOUNTER — Encounter: Payer: Self-pay | Admitting: *Deleted

## 2015-08-26 ENCOUNTER — Ambulatory Visit: Payer: BLUE CROSS/BLUE SHIELD | Admitting: General Surgery

## 2015-09-15 ENCOUNTER — Encounter: Payer: Self-pay | Admitting: Physical Therapy

## 2015-09-15 ENCOUNTER — Ambulatory Visit: Payer: BLUE CROSS/BLUE SHIELD | Attending: Family Medicine | Admitting: Physical Therapy

## 2015-09-15 DIAGNOSIS — M546 Pain in thoracic spine: Secondary | ICD-10-CM | POA: Diagnosis not present

## 2015-09-15 DIAGNOSIS — M25511 Pain in right shoulder: Secondary | ICD-10-CM

## 2015-09-15 DIAGNOSIS — M62838 Other muscle spasm: Secondary | ICD-10-CM | POA: Insufficient documentation

## 2015-09-15 DIAGNOSIS — M25512 Pain in left shoulder: Secondary | ICD-10-CM | POA: Diagnosis present

## 2015-09-16 NOTE — Therapy (Signed)
[name] [address] [place], [address] Phone: [phone]   Fax:  [phone]  Physical Therapy Evaluation  Patient Details  Name: Abigail Montes MRN: [id number] Date of Birth: April 12, 1980 Referring Provider: Melanie Crazier MD  Encounter Date: 09/15/2015      PT End of Session - 09/15/15 1822    Visit Number 1   Number of Visits 12   Date for PT Re-Evaluation 10/27/15   PT Start Time 1726   PT Stop Time 1825   PT Time Calculation (min) 59 min   Activity Tolerance Patient tolerated treatment well   Behavior During Therapy [facility] for tasks assessed/performed      Past Medical History:  Diagnosis Date  . Arthritis     Past Surgical History:  Procedure Laterality Date  . BREAST CYST ASPIRATION Left 2016  . FOOT TENDON SURGERY  08/2013   torn tendon  . TUBAL LIGATION  08/2003    There were no vitals filed for this visit.       Subjective Assessment - 09/15/15 1746    Subjective Patient reports she is having aching, burning symptoms in both shoulders, neck and back (upper to mid back).    Pertinent History Long history of back pain that began following first pregnancy 1998 and has had exacerbations of neck and back pain with worsening pain over the past few years with full time employment.    Limitations Sitting;Reading;Lifting;Standing;House hold activities;Other (comment)  works full time and housekeeping   How long can you sit comfortably? <5 min.   How long can you stand comfortably? <10 min.   How long can you walk comfortably? 15 min.   Diagnostic tests None   Patient Stated Goals to get relief of burning in back so she can play sports, sit at desk without hurting    Currently in Pain? Yes   Pain Score 7   best 0/10 ranges up to 7/10   Pain Location Back  neck and shoulders   Pain Descriptors / Indicators Aching;Burning   Pain Type Chronic pain   Pain Onset More than a month ago    Pain Frequency Intermittent   Aggravating Factors  sitting, lifting, standing   Pain Relieving Factors heat, stretching   Effect of Pain on Daily Activities limits ability to sit, stand and perform activties without difficulty            [facility] PT Assessment - 09/15/15 1739      Assessment   Medical Diagnosis Chronic back and neck pain   Referring Provider Melanie Crazier MD   Onset Date/Surgical Date 05/24/96  flare ups over the years with different activities, working   Hand Dominance Right   Next MD Visit not scheduled   Prior Therapy none     Precautions   Precautions None     Balance Screen   Has the patient fallen in the past 6 months No   Has the patient had a decrease in activity level because of a fear of falling?  No   Is the patient reluctant to leave their home because of a fear of falling?  No     Home Environment   Living Environment Private residence   Type of [place] to enter   Entrance Stairs-Number of Steps 3   Entrance Stairs-Rails None   Home Layout Two level  lives on first level   Alternate Level Stairs-Number of  Steps 15   Alternate Level Stairs-Rails None     Prior Function   Level of Independence Independent   Vocation Full time employment  office manager   Vocation Requirements sitting 8 hours with a few breaks   Leisure fish, play softball     Cognition   Overall Cognitive Status Within Functional Limits for tasks assessed     Objective: AROM:  cervical spine: flexion, extension rotations and lateral flexion all WNL's without reproduction of symptoms UE's: both shoulders WNL's all planes without reproduction of symptoms Strength: both UE's grossly strong and no pain reproduces in either shoulder, periscapular control decreased for retraction and lower trapezius activation Posture: mild forward head posture, rounded shoulders Joint mobility: thoracic spine to be assessed Palpation: + spasms along  bilateral thoracic spine with + tenderness elicited  Outcome measure: modified oswestry back pain questionnaire 44%  Treatment: Therapeutic exercise: patient performed exercises with guidance, verbal and tactile cues and demonstration of PT: Scapular retraction in sitting and standing at door frame performed 3 sets x 10 reps Cervical retraction x 10 with verbal and tactile cues Thoracic spine extension in chair Pectoral stretch lying supine with towel roll along thoracic spine (home program) Patient response to treatment: improved technique with repetition of exercises with verbal and tactile cuing for correct alignment and positioning         PT Education - 09/15/15 1800    Education provided Yes   Education Details HEP: scapular retraction, posture correction for sleeping, sitting using rolls for support, thoracic spine extension over back of chair, cervical spine retraction   Person(s) Educated Patient   Methods Explanation;Demonstration;Verbal cues;Handout   Comprehension Verbalized understanding;Returned demonstration;Verbal cues required             PT Long Term Goals - 09/15/15 1825      PT LONG TERM GOAL #1   Title Patient will demonstrate decreased pain, improved function with UE's and be able to sit at computer >30 min. without increased back pain as noted with MODI score of 30% or better by 10/27/2015   Baseline MODI 44% (moderate self perceived disability   Status New     PT LONG TERM GOAL #2   Title Patient will be independent without cuing for home program for pain control, exercise progression once discharged from physical therapy to continue improvement and self managment by 10/27/2015   Baseline Patient requires moderate cuing and has limited to no knowledge of appropriate exercises and progression to improve pain/strength   Status New               Plan - 09/15/15 1825    Clinical Impression Statement Patient is a 35 yo right hand dominant female  who presents with back and shoulder pain that limits her function with sitting at computer, performing personal care and household activities. She has limitations of strenght and endurance in upper body, core and limited knowledge of appropriate pain control, progression of exercises in order to improve function. Her MODI is 44% indicating moderate self perceived disability and will need intervention to improve limitations.    Rehab Potential Good   Clinical Impairments Affecting Rehab Potential (+) age, motivated (-) chronic condition, large breasts may be contributing to pain   PT Frequency 2x / week   PT Duration 6 weeks   PT Treatment/Interventions Patient/family education;Electrical Stimulation;Neuromuscular re-education;Cryotherapy;Moist Heat;Dry needling;Therapeutic exercise;Ultrasound;Manual techniques;Taping   PT Next Visit Plan progress exercises, taping for posture, pain control   PT Home Exercise Plan posture  awareness, proper sitting with lumbar roll, sleep with cervical roll   Consulted and Agree with Plan of Care Patient      Patient will benefit from skilled therapeutic intervention in order to improve the following deficits and impairments:  Postural dysfunction, Decreased strength, Pain, Decreased endurance, Impaired UE functional use  Visit Diagnosis: Pain in thoracic spine - Plan: PT plan of care cert/re-cert  Pain in left shoulder - Plan: PT plan of care cert/re-cert  Pain in right shoulder - Plan: PT plan of care cert/re-cert     Problem List Patient Active Problem List   Diagnosis Date Noted  . Breast nodule 10/01/2014  . Breast mass, left 08/21/2014  . Costochondritis 07/01/2014    Jomarie Longs PT 09/16/2015, 6:04 PM  [facility] [address], [place], [address] Phone: [phone]   Fax:  [phone]  Name: TAZMIN BENNISON MRN: [id number] Date of Birth: 1980/02/07

## 2015-09-17 NOTE — Progress Notes (Signed)
DOS 03/13/2015 Endoscopic plantar fasciotomy left foot.

## 2015-09-22 ENCOUNTER — Ambulatory Visit: Payer: BLUE CROSS/BLUE SHIELD | Admitting: Physical Therapy

## 2015-09-24 ENCOUNTER — Ambulatory Visit: Payer: BLUE CROSS/BLUE SHIELD | Admitting: Physical Therapy

## 2015-09-24 ENCOUNTER — Encounter: Payer: Self-pay | Admitting: Physical Therapy

## 2015-09-24 DIAGNOSIS — M546 Pain in thoracic spine: Secondary | ICD-10-CM | POA: Diagnosis not present

## 2015-09-24 DIAGNOSIS — M25512 Pain in left shoulder: Secondary | ICD-10-CM

## 2015-09-24 DIAGNOSIS — M62838 Other muscle spasm: Secondary | ICD-10-CM

## 2015-09-24 DIAGNOSIS — M25511 Pain in right shoulder: Secondary | ICD-10-CM

## 2015-09-24 NOTE — Therapy (Signed)
[facility] [address], [place], [address] Phone: [phone]   Fax:  [phone]  Physical Therapy Treatment  Patient Details  Name: Abigail Montes MRN: [id number] Date of Birth: Oct 22, 1980 Referring Provider: Melanie Crazier MD  Encounter Date: 09/24/2015      PT End of Session - 09/24/15 1618    Visit Number 2   Number of Visits 12   Date for PT Re-Evaluation 10/27/15   PT Start Time [id number]   PT Stop Time 1615   PT Time Calculation (min) 38 min   Activity Tolerance Patient tolerated treatment well   Behavior During Therapy [facility] for tasks assessed/performed      Past Medical History:  Diagnosis Date  . Arthritis     Past Surgical History:  Procedure Laterality Date  . BREAST CYST ASPIRATION Left 2016  . FOOT TENDON SURGERY  08/2013   torn tendon  . TUBAL LIGATION  08/2003    There were no vitals filed for this visit.      Subjective Assessment - 09/24/15 1540    Subjective Patient reports same symptoms as the other day. She gets some relief temporarily with exercises.    Limitations Sitting;Reading;Lifting;Standing;House hold activities;Other (comment)   Patient Stated Goals to get relief of burning in back so she can play sports, sit at desk without hurting    Currently in Pain? Yes   Pain Score 7    Pain Location Back   Pain Orientation Mid   Pain Descriptors / Indicators Aching;Burning   Pain Type Chronic pain   Pain Onset More than a month ago   Pain Frequency Intermittent     Objective: Palpation: cervical spine, upper back with + spasms along medial border of right scapula and upper trapezius  Treatment: Manual therapy: STM performed along right side upper back, thoracic spine into upper trapezius muscle with patient seated in chair goal: pain, muscle spasm reduction  Therapeutic exercise:patient performed exercises with guidance, verbal and tactile cues and demonstration  of PT:  Sitting/standing: scapular retraction re assessed along with thoracic extension exercises with hands behind head Instructed in resistive band exercise for scapular retraction and shoulder extension to hips: performed with green resistive band x 10 reps each   Modalities: Electrical stimulation: High volt estim. Muscle spasm program; (4) electrodes applied to upper trapezius muscles and mid thoracic spine with patient seated in chair: moist heat applied to cervical spine/upper trapezius muscles   Patient response to treatment: decreased pain and spasms in upper back, upper trapezius muscles following high volt estim. And moist heat with no adverse reactions noted following treatment, good demonstration of exercises following demonstration and with verbal cues         PT Education - 09/24/15 1612    Education provided Yes   Education Details HEP: continue with scapular restraction at door, added resistive band scapular retraction   Person(s) Educated Patient   Methods Explanation;Demonstration;Verbal cues   Comprehension Verbalized understanding;Returned demonstration;Verbal cues required             PT Long Term Goals - 09/15/15 1825      PT LONG TERM GOAL #1   Title Patient will demonstrate decreased pain, improved function with UE's and be able to sit at computer >30 min. without increased back pain as noted with MODI score of 30% or better by 10/27/2015   Baseline MODI 44% (moderate self perceived disability   Status New  PT LONG TERM GOAL #2   Title Patient will be independent without cuing for home program for pain control, exercise progression once discharged from physical therapy to continue improvement and self managment by 10/27/2015   Baseline Patient requires moderate cuing and has limited to no knowledge of appropriate exercises and progression to improve pain/strength   Status New               Plan - 09/24/15 1630    Clinical Impression  Statement Patient responded well to treatment with decreased pain to mild and decreased spasms and tenderness along right side upper back and trapezius muscles. She is progressing with exercises to include strengthening and motor control and should continue to improve with additional physical therapy intervention.    Rehab Potential Good   PT Frequency 2x / week   PT Duration 6 weeks   PT Treatment/Interventions Patient/family education;Electrical Stimulation;Neuromuscular re-education;Cryotherapy;Moist Heat;Dry needling;Therapeutic exercise;Ultrasound;Manual techniques;Taping   PT Next Visit Plan progress exercises, taping for posture, pain control   PT Home Exercise Plan posture awareness, proper sitting with lumbar roll, sleep with cervical roll      Patient will benefit from skilled therapeutic intervention in order to improve the following deficits and impairments:  Postural dysfunction, Decreased strength, Pain, Decreased endurance, Impaired UE functional use  Visit Diagnosis: Pain in thoracic spine  Pain in left shoulder  Pain in right shoulder  Other muscle spasm     Problem List Patient Active Problem List   Diagnosis Date Noted  . Breast nodule 10/01/2014  . Breast mass, left 08/21/2014  . Costochondritis 07/01/2014    Jomarie Longs PT 09/24/2015, 10:56 PM  [facility] [address], [place], [address] Phone: [phone]   Fax:  [phone]  Name: Abigail Montes MRN: [id number] Date of Birth: September 22, 1980

## 2015-09-29 ENCOUNTER — Ambulatory Visit: Payer: BLUE CROSS/BLUE SHIELD | Attending: Family Medicine | Admitting: Physical Therapy

## 2015-09-29 ENCOUNTER — Encounter: Payer: Self-pay | Admitting: Physical Therapy

## 2015-09-29 DIAGNOSIS — M25512 Pain in left shoulder: Secondary | ICD-10-CM

## 2015-09-29 DIAGNOSIS — M25511 Pain in right shoulder: Secondary | ICD-10-CM

## 2015-09-29 DIAGNOSIS — M546 Pain in thoracic spine: Secondary | ICD-10-CM

## 2015-09-29 DIAGNOSIS — M62838 Other muscle spasm: Secondary | ICD-10-CM

## 2015-09-29 DIAGNOSIS — M6281 Muscle weakness (generalized): Secondary | ICD-10-CM | POA: Diagnosis present

## 2015-09-30 ENCOUNTER — Ambulatory Visit: Payer: BLUE CROSS/BLUE SHIELD | Admitting: Physical Therapy

## 2015-09-30 NOTE — Therapy (Signed)
[facility] [address] [place], [address] Phone: [phone]   Fax:  [phone]  Physical Therapy Treatment  Patient Details  Name: Abigail Montes MRN: [id number] Date of Birth: 07-Jan-1981 Referring Provider: Melanie Crazier MD  Encounter Date: 09/29/2015      PT End of Session - 09/29/15 1820    Visit Number 3   Number of Visits 12   Date for PT Re-Evaluation 10/27/15   PT Start Time 1816   PT Stop Time 1900   PT Time Calculation (min) 44 min   Activity Tolerance Patient tolerated treatment well   Behavior During Therapy [facility] for tasks assessed/performed      Past Medical History:  Diagnosis Date  . Arthritis     Past Surgical History:  Procedure Laterality Date  . BREAST CYST ASPIRATION Left 2016  . FOOT TENDON SURGERY  08/2013   torn tendon  . TUBAL LIGATION  08/2003    There were no vitals filed for this visit.      Subjective Assessment - 09/29/15 1817    Subjective Patient reports she is still having some pain in in her upper back. Band exercises seem to be aggravating pain at this time.    Limitations Sitting;Reading;Lifting;Standing;House hold activities;Other (comment)   Patient Stated Goals to get relief of burning in back so she can play sports, sit at desk without hurting    Currently in Pain? Yes   Pain Score 7    Pain Location Back   Pain Orientation Mid   Pain Descriptors / Indicators Aching;Burning   Pain Type Chronic pain   Pain Onset More than a month ago        Objective: Posture: forward rounded shoulders, mild forward head sitting posture Palpation: + tender to palpation along upper back, thoracic spine/upper trapezius muscles  Treatment: Therapeutic exercise:patient performed exercises with guidance, verbal and tactile cues and demonstration of PT: Sitting/standing:  At [place] scapular retraction 10# with hands supinated x 15 reps Standing  straight arm pull downs 15# x 15 reps Seated reverse pull ups 20# x 15 reps Single arm row 5# standing 15 reps each UE  Modalities: Electrical stimulation: High volt estim. Muscle spasm program; (4) electrodes applied to upper trapezius muscles and mid thoracic spine with patient seated in chair: moist heat applied to cervical spine/upper trapezius muscles: no adverse effects noted    Patient response to treatment: Patient demonstrated good technique with moderate cuing for correct alignment with shoulder, trunk during exercises, patient with noted fatigue in periscapular musculature with all exercises, decreased spasms and tenderness 50% with estim./moist heat at end of session        PT Education - 09/29/15 1819    Education provided Yes   Education Details HEP: re assessed exercises for home, posture with sitting, soreness following new exercises to be expected due to strengthening muscles   Person(s) Educated Patient   Methods Explanation;Demonstration;Verbal cues   Comprehension Verbalized understanding;Returned demonstration;Verbal cues required             PT Long Term Goals - 09/15/15 1825      PT LONG TERM GOAL #1   Title Patient will demonstrate decreased pain, improved function with UE's and be able to sit at computer >30 min. without increased back pain as noted with MODI score of 30% or better by 10/27/2015   Baseline MODI 44% (moderate self perceived disability   Status New  PT LONG TERM GOAL #2   Title Patient will be independent without cuing for home program for pain control, exercise progression once discharged from physical therapy to continue improvement and self managment by 10/27/2015   Baseline Patient requires moderate cuing and has limited to no knowledge of appropriate exercises and progression to improve pain/strength   Status New               Plan - 09/29/15 1900    Clinical Impression Statement Patient continues wtih pain and weakness  in upper back and is responding favorably to electrical stimulation and moist heat for pain control. She has minimal carry over of pain relief and strength gains due to chronic condition and prolonged sitting throughout the day and multiple jobs. She will benefit from additional physical therapy intervention to further reduce pain, improve strength to allow self managment of symptoms.    Rehab Potential Good   PT Frequency 2x / week   PT Duration 6 weeks   PT Treatment/Interventions Patient/family education;Electrical Stimulation;Neuromuscular re-education;Cryotherapy;Moist Heat;Dry needling;Therapeutic exercise;Ultrasound;Manual techniques;Taping   PT Next Visit Plan progress exercises, taping for posture, pain control, modalities as indicated   PT Home Exercise Plan posture awareness, proper sitting with lumbar roll, sleep with cervical roll      Patient will benefit from skilled therapeutic intervention in order to improve the following deficits and impairments:  Postural dysfunction, Decreased strength, Pain, Decreased endurance, Impaired UE functional use  Visit Diagnosis: Pain in thoracic spine  Pain in left shoulder  Pain in right shoulder  Other muscle spasm  Muscle weakness (generalized)     Problem List Patient Active Problem List   Diagnosis Date Noted  . Breast nodule 10/01/2014  . Breast mass, left 08/21/2014  . Costochondritis 07/01/2014    Jomarie Longs PT 09/30/2015, 8:08 AM  [facility] [address] [place], [address] Phone: [phone]   Fax:  [phone]  Name: Abigail Montes MRN: [id number] Date of Birth: 1980-05-31

## 2015-10-02 ENCOUNTER — Ambulatory Visit: Payer: BLUE CROSS/BLUE SHIELD | Admitting: Physical Therapy

## 2015-10-02 ENCOUNTER — Encounter: Payer: Self-pay | Admitting: Physical Therapy

## 2015-10-02 DIAGNOSIS — M25511 Pain in right shoulder: Secondary | ICD-10-CM

## 2015-10-02 DIAGNOSIS — M25512 Pain in left shoulder: Secondary | ICD-10-CM

## 2015-10-02 DIAGNOSIS — M546 Pain in thoracic spine: Secondary | ICD-10-CM | POA: Diagnosis not present

## 2015-10-02 DIAGNOSIS — M62838 Other muscle spasm: Secondary | ICD-10-CM

## 2015-10-02 DIAGNOSIS — M6281 Muscle weakness (generalized): Secondary | ICD-10-CM

## 2015-10-02 NOTE — Therapy (Signed)
[place] [facility] [address], [place], [address] Phone: [phone]   Fax:  [phone]  Physical Therapy Treatment  Patient Details  Name: Abigail Montes MRN: [id number] Date of Birth: [birth year] Referring Provider: Melanie Crazier MD  Encounter Date: 10/02/2015      PT End of Session - 10/02/15 0849    Visit Number 4   Number of Visits 12   Date for PT Re-Evaluation 10/27/15   PT Start Time 0808   PT Stop Time 0855   PT Time Calculation (min) 47 min   Activity Tolerance Patient tolerated treatment well   Behavior During Therapy [facility] for tasks assessed/performed      Past Medical History:  Diagnosis Date  . Arthritis     Past Surgical History:  Procedure Laterality Date  . BREAST CYST ASPIRATION Left 2016  . FOOT TENDON SURGERY  08/2013   torn tendon  . TUBAL LIGATION  08/2003    There were no vitals filed for this visit.      Subjective Assessment - 10/02/15 0810    Subjective Patient reports she is still about the same with pain. 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    Limitations Sitting;Reading;Lifting;Standing;House hold activities;Other (comment)   Patient Stated Goals to get relief of burning in back so she can play sports, sit at desk without hurting    Currently in Pain? Yes   Pain Score 7    Pain Location Back   Pain Orientation Mid   Pain Descriptors / Indicators Aching;Burning   Pain Type Chronic pain   Pain Onset More than a month ago   Pain Frequency Intermittent        Objective: Posture: mild rounded shoulders, forward head position Palpation: mild tenderness with spasms palpable along cervical spine into thoracic spine  Treatment: Therapeutic exercise:patient performed exercises with guidance, verbal and tactile cues and demonstration of PT: Sitting/standing:  At [place] scapular retraction 10# with  hands supinated x 15 reps Standing straight arm pull downs 15# x 15 reps Seated reverse pull ups 20# x 15 reps Single arm row 10# standing 2 x 15 reps each UE Total gym assisted chin ups height #11 x 15 reps with fatigue  Modalities: Electrical stimulation: High volt estim. Muscle spasm program; (4) electrodes applied to upper trapezius muscles and mid thoracic spine with patient seated in chair: moist heat applied to cervical spine/upper trapezius muscles x 20 min.: no adverse effects noted    Patient response to treatment: Patient demonstrated improved motor control with all exercises with minimal cuing for correct alignment, mild fatigue noted with all exercises for periscapular muscles, decreased pain and spasms to mild at end of session           PT Education - 10/02/15 1047    Education provided Yes   Education Details HEP: added thoracic spine extension/stretches and re assessed cervical retraction/extension to continue with scapular retraction/strengthening   Person(s) Educated Patient   Methods Explanation;Demonstration;Verbal cues;Handout   Comprehension Verbalized understanding;Returned demonstration;Verbal cues required;Tactile cues required             PT Long Term Goals - 09/15/15 1825      PT LONG TERM GOAL #1   Title Patient will demonstrate decreased pain, improved function with UE's and be able to sit at computer >30 min. without increased back pain as noted with MODI score of 30% or  better by 10/27/2015   Baseline MODI 44% (moderate self perceived disability   Status New     PT LONG TERM GOAL #2   Title Patient will be independent without cuing for home program for pain control, exercise progression once discharged from physical therapy to continue improvement and self managment by 10/27/2015   Baseline Patient requires moderate cuing and has limited to no knowledge of appropriate exercises and progression to improve pain/strength   Status New                Plan - 10/02/15 1036    Clinical Impression Statement Patient continues to progress slowly due to multiple job responsibilities that place her in poor positions/postures ie sitting, cleaning. She is progressing well with exercises and demonstrtates improving motor control and strength with all exercises and is responding favorably to modalities for pain control and should continue to progress with additional physical therapy intervention.    Rehab Potential Good   PT Frequency 2x / week   PT Duration 6 weeks   PT Treatment/Interventions Patient/family education;Electrical Stimulation;Neuromuscular re-education;Cryotherapy;Moist Heat;Dry needling;Therapeutic exercise;Ultrasound;Manual techniques;Taping   PT Next Visit Plan progress exercises, taping for posture, pain control, modalities as indicated   PT Home Exercise Plan posture awareness, proper sitting with lumbar roll, sleep with cervical roll, additional posture and stretching for cervical/thoracic spine      Patient will benefit from skilled therapeutic intervention in order to improve the following deficits and impairments:  Postural dysfunction, Decreased strength, Pain, Decreased endurance, Impaired UE functional use  Visit Diagnosis: Pain in thoracic spine  Other muscle spasm  Pain in left shoulder  Pain in right shoulder  Muscle weakness (generalized)     Problem List Patient Active Problem List   Diagnosis Date Noted  . Breast nodule 10/01/2014  . Breast mass, left 08/21/2014  . Costochondritis 07/01/2014    Jomarie Longs PT 10/02/2015, 10:48 AM  [facility] [address] [place], [address] Phone: [phone]   Fax:  [phone]  Name: Abigail Montes MRN: [id number] Date of Birth: 06-09-1980

## 2015-10-06 ENCOUNTER — Ambulatory Visit: Payer: BLUE CROSS/BLUE SHIELD | Admitting: Physical Therapy

## 2015-10-06 ENCOUNTER — Encounter: Payer: Self-pay | Admitting: Physical Therapy

## 2015-10-06 DIAGNOSIS — M62838 Other muscle spasm: Secondary | ICD-10-CM

## 2015-10-06 DIAGNOSIS — M25512 Pain in left shoulder: Secondary | ICD-10-CM

## 2015-10-06 DIAGNOSIS — M6281 Muscle weakness (generalized): Secondary | ICD-10-CM

## 2015-10-06 DIAGNOSIS — M25511 Pain in right shoulder: Secondary | ICD-10-CM

## 2015-10-06 DIAGNOSIS — M546 Pain in thoracic spine: Secondary | ICD-10-CM

## 2015-10-07 NOTE — Therapy (Signed)
[facility] [address] [place], [address] Phone: [phone]   Fax:  [phone]  Physical Therapy Treatment  Patient Details  Name: ZYERIA HAYRE MRN: [id number] Date of Birth: [birth year] Referring Provider: Melanie Crazier MD  Encounter Date: 10/06/2015      PT End of Session - 10/06/15 1846    Visit Number 5   Number of Visits 12   Date for PT Re-Evaluation 10/27/15   PT Start Time 1812   PT Stop Time 1900   PT Time Calculation (min) 48 min   Activity Tolerance Patient tolerated treatment well   Behavior During Therapy [facility] for tasks assessed/performed      Past Medical History:  Diagnosis Date  . Arthritis     Past Surgical History:  Procedure Laterality Date  . BREAST CYST ASPIRATION Left 2016  . FOOT TENDON SURGERY  08/2013   torn tendon  . TUBAL LIGATION  08/2003    There were no vitals filed for this visit.      Subjective Assessment - 10/06/15 1813    Subjective Patient reports she is about the same with upper back pain. She is doing exercises as instructed   Limitations Sitting;Reading;Lifting;Standing;House hold activities;Other (comment)   Patient Stated Goals to get relief of burning in back so she can play sports, sit at desk without hurting    Currently in Pain? Yes   Pain Score 7    Pain Location Back   Pain Orientation Mid;Upper   Pain Descriptors / Indicators Aching   Pain Type Chronic pain   Pain Onset More than a month ago   Pain Frequency Intermittent      Objective: Posture: mild rounded shoulders, forward head position Palpation: mild spasm palpable bilateral upper trapezius  Treatment: Therapeutic exercise:patient performed exercises with guidance, verbal and tactile cues and demonstration of PT: Sitting/standing:  At [place] scapular retraction 10# with hands supinated x 15 reps Standing straight arm pull downs 15# x 15 reps Seated reverse  pull ups 20# x 15 reps Single arm row 10# standing 2 x 15 reps each UE Total gym assisted chin ups height #11 x 15 reps with fatigue  Modalities: Electrical stimulation: High volt estim. Muscle spasm program; (4) electrodes applied to upper trapezius muscles and mid thoracic spine with patient seated in chair: moist heat applied to cervical spine/upper trapezius muscles x 20 min.: no adverse effects noted   Patient response to treatment: Patient demonstrated improved posture with exercises with verbal cuing for correct alignment of trunk, UE's. Decreased pain by 50% with estim. And moist heat         PT Education - 10/06/15 1819    Education provided Yes   Education Details HEP; continue with exercises and posture awareness for assisting with back pain   Person(s) Educated Patient   Methods Explanation;Demonstration;Verbal cues   Comprehension Verbalized understanding;Returned demonstration;Verbal cues required             PT Long Term Goals - 09/15/15 1825      PT LONG TERM GOAL #1   Title Patient will demonstrate decreased pain, improved function with UE's and be able to sit at computer >30 min. without increased back pain as noted with MODI score of 30% or better by 10/27/2015   Baseline MODI 44% (moderate self perceived disability   Status New     PT LONG TERM GOAL #2   Title Patient will be independent without cuing  for home program for pain control, exercise progression once discharged from physical therapy to continue improvement and self managment by 10/27/2015   Baseline Patient requires moderate cuing and has limited to no knowledge of appropriate exercises and progression to improve pain/strength   Status New               Plan - 10/06/15 1900    Clinical Impression Statement Pateint continues with chief complaint of pain in upper back with temporary relief of symptoms with current treatmentand with exercises at home. She is responding slowly due to  chronic condition and holding down 3 jobs, working 7 days a week.     Rehab Potential Good   PT Frequency 2x / week   PT Duration 6 weeks   PT Next Visit Plan progress exercises, taping for posture, pain control, modalities as indicated   PT Home Exercise Plan posture awareness, proper sitting with lumbar roll, sleep with cervical roll, additional posture and stretching for cervical/thoracic spine      Patient will benefit from skilled therapeutic intervention in order to improve the following deficits and impairments:  Postural dysfunction, Decreased strength, Pain, Decreased endurance, Impaired UE functional use  Visit Diagnosis: Other muscle spasm  Pain in thoracic spine  Pain in left shoulder  Pain in right shoulder  Muscle weakness (generalized)     Problem List Patient Active Problem List   Diagnosis Date Noted  . Breast nodule 10/01/2014  . Breast mass, left 08/21/2014  . Costochondritis 07/01/2014    Jomarie Longs PT 10/07/2015, 1:51 PM  [facility] [address], [place], [address] Phone: [phone]   Fax:  [phone]  Name: KAILIANA HARRIGAN MRN: [id number] Date of Birth: 1980/07/22

## 2015-10-08 ENCOUNTER — Ambulatory Visit: Payer: BLUE CROSS/BLUE SHIELD | Admitting: Physical Therapy

## 2015-10-08 ENCOUNTER — Encounter: Payer: Self-pay | Admitting: *Deleted

## 2015-10-12 ENCOUNTER — Ambulatory Visit (INDEPENDENT_AMBULATORY_CARE_PROVIDER_SITE_OTHER): Payer: BLUE CROSS/BLUE SHIELD | Admitting: Podiatry

## 2015-10-12 ENCOUNTER — Encounter: Payer: Self-pay | Admitting: Podiatry

## 2015-10-12 DIAGNOSIS — M722 Plantar fascial fibromatosis: Secondary | ICD-10-CM

## 2015-10-12 MED ORDER — DICLOFENAC SODIUM 1 % TD GEL: 4.0000 g | Freq: Four times a day (QID) | TRANSDERMAL | 3 refills | Status: DC

## 2015-10-12 MED ORDER — METHYLPREDNISOLONE 4 MG PO TBPK: ORAL_TABLET | ORAL | 0 refills | Status: DC

## 2015-10-12 NOTE — Progress Notes (Signed)
She presents today after having not seen her since April 2017 with a chief complaint of bilateral heel pain. She states that she has been working 3 jobs, her husband has been out of work for his right shoulder for the past 2 years. She has previously undergone endoscopic plantar fasciotomies left foot February 2017 in her right foot August 2015. She denies any changes in her past history medications allergy surgery social history.  Objective: Vital signs are stable alert and oriented 3. Pulses are palpable. She has severe pain on palpation medially K notable bilateral. No pain across the rest of the foot. Pulses remain strong and palpable neurologic symptoms from his intact.  Assessment: Chronic intractable plantar fasciitis left greater than right.  Plan: Injected the bilateral heels today with Kenalog and local anesthetic started her on a Medrol Dosepak to be followed by diclofenac gel. We also dispensed to L 1902 braces. We may need to consider an MRI and consider complete transverse fasciotomies with a gastroc recession.

## 2015-10-13 ENCOUNTER — Ambulatory Visit: Payer: BLUE CROSS/BLUE SHIELD | Admitting: Physical Therapy

## 2015-10-13 ENCOUNTER — Encounter: Payer: Self-pay | Admitting: Physical Therapy

## 2015-10-13 DIAGNOSIS — M6281 Muscle weakness (generalized): Secondary | ICD-10-CM

## 2015-10-13 DIAGNOSIS — M25512 Pain in left shoulder: Secondary | ICD-10-CM

## 2015-10-13 DIAGNOSIS — M62838 Other muscle spasm: Secondary | ICD-10-CM

## 2015-10-13 DIAGNOSIS — M25511 Pain in right shoulder: Secondary | ICD-10-CM

## 2015-10-13 DIAGNOSIS — M546 Pain in thoracic spine: Secondary | ICD-10-CM | POA: Diagnosis not present

## 2015-10-13 NOTE — Therapy (Signed)
[facility] [address] [place], [address] Phone: [phone]   Fax:  [phone]  Physical Therapy Treatment  Patient Details  Name: Abigail Montes MRN: [id number] Date of Birth: 04-30-1980 Referring Provider: Melanie Crazier MD  Encounter Date: 10/13/2015      PT End of Session - 10/13/15 1838    Visit Number 6   Number of Visits 12   Date for PT Re-Evaluation 10/27/15   PT Start Time [id number]   PT Stop Time 1854   PT Time Calculation (min) 56 min   Activity Tolerance Patient tolerated treatment well   Behavior During Therapy [facility] for tasks assessed/performed      Past Medical History:  Diagnosis Date  . Arthritis     Past Surgical History:  Procedure Laterality Date  . BREAST CYST ASPIRATION Left 2016  . FOOT TENDON SURGERY  08/2013   torn tendon  . TUBAL LIGATION  08/2003    There were no vitals filed for this visit.      Subjective Assessment - 10/13/15 1800    Subjective Back pain is worse today and she did not weed eat over the weekend. She is now taking prednisone for feet pain and had injections in feet as well.    Limitations Sitting;Reading;Lifting;Standing;House hold activities;Other (comment)   Patient Stated Goals to get relief of burning in back so she can play sports, sit at desk without hurting    Currently in Pain? Yes   Pain Score 8    Pain Location Back  upper back just below scapulae   Pain Orientation Mid;Upper   Pain Descriptors / Indicators Burning;Aching   Pain Type Chronic pain   Pain Onset More than a month ago   Pain Frequency Intermittent        Objective: Posture: mild forward head position, improving   Treatment: Therapeutic exercise:patient performed exercises with guidance, verbal and tactile cues and demonstration of PT: Sitting/standing:  At [place] scapular retraction 10# with hands supinated x 15 reps, 15# x 15 reps Standing straight  arm pull downs 15# x 15 reps Seated reverse pull ups 20# x 15 reps Single arm row 10# standing 1 x15 reps each UE Total gym assisted chin ups height #12 x 15 reps with fatigue  Modalities: Electrical stimulation: High volt estim. Muscle spasm program; (4) electrodes applied to upper trapezius muscles and mid thoracic spine with patient seated in chair: moist heat applied to cervical spine/upper trapezius muscles x 20 min.: no adverse effects noted   Patient response to treatment: patient demonstrated improved strength with increased intensity of exercises with OMEGA and pull ups on total gym; She was able to perform all exercises with minimal cuing for correct positioning/posture      PT Education - 10/13/15 1806    Education provided Yes   Education Details re assessed home exercises for scapular retraction.   Person(s) Educated Patient   Methods Explanation;Demonstration;Verbal cues   Comprehension Verbalized understanding;Returned demonstration;Verbal cues required             PT Long Term Goals - 09/15/15 1825      PT LONG TERM GOAL #1   Title Patient will demonstrate decreased pain, improved function with UE's and be able to sit at computer >30 min. without increased back pain as noted with MODI score of 30% or better by 10/27/2015   Baseline MODI 44% (moderate self perceived disability   Status New  PT LONG TERM GOAL #2   Title Patient will be independent without cuing for home program for pain control, exercise progression once discharged from physical therapy to continue improvement and self managment by 10/27/2015   Baseline Patient requires moderate cuing and has limited to no knowledge of appropriate exercises and progression to improve pain/strength   Status New               Plan - 10/13/15 1835    Clinical Impression Statement Patient continues with pain and burning in her mid back that is responding slowly to treatment due to chronicity of  condition. She is improving posture awareness and strength as demonstrated with increased intensity of exercises and minimal cuing required to perform exercises correctly.   Rehab Potential Good   PT Frequency 2x / week   PT Duration 6 weeks   PT Treatment/Interventions Patient/family education;Electrical Stimulation;Neuromuscular re-education;Cryotherapy;Moist Heat;Dry needling;Therapeutic exercise;Ultrasound;Manual techniques;Taping   PT Next Visit Plan progress exercises, pain control, modalities as indicated   PT Home Exercise Plan posture awareness, proper sitting with lumbar roll, sleep with cervical roll, additional posture and stretching for cervical/thoracic spine      Patient will benefit from skilled therapeutic intervention in order to improve the following deficits and impairments:  Postural dysfunction, Decreased strength, Pain, Decreased endurance, Impaired UE functional use  Visit Diagnosis: Other muscle spasm  Pain in thoracic spine  Pain in left shoulder  Pain in right shoulder  Muscle weakness (generalized)     Problem List Patient Active Problem List   Diagnosis Date Noted  . Breast nodule 10/01/2014  . Breast mass, left 08/21/2014  . Costochondritis 07/01/2014    Jomarie Longs PT 10/14/2015, 7:23 PM  [facility] [address] [place], [address] Phone: [phone]   Fax:  [phone]  Name: Abigail Montes MRN: [id number] Date of Birth: [birth year]

## 2015-10-15 ENCOUNTER — Ambulatory Visit: Payer: BLUE CROSS/BLUE SHIELD | Admitting: Physical Therapy

## 2015-10-15 ENCOUNTER — Encounter: Payer: Self-pay | Admitting: Physical Therapy

## 2015-10-15 DIAGNOSIS — M25512 Pain in left shoulder: Secondary | ICD-10-CM

## 2015-10-15 DIAGNOSIS — M546 Pain in thoracic spine: Secondary | ICD-10-CM | POA: Diagnosis not present

## 2015-10-15 DIAGNOSIS — M6281 Muscle weakness (generalized): Secondary | ICD-10-CM

## 2015-10-15 DIAGNOSIS — M25511 Pain in right shoulder: Secondary | ICD-10-CM

## 2015-10-15 DIAGNOSIS — M62838 Other muscle spasm: Secondary | ICD-10-CM

## 2015-10-16 NOTE — Therapy (Signed)
[facility] [facility] [address] [place], [address] Phone: [phone]   Fax:  [phone]  Physical Therapy Treatment  Patient Details  Name: Abigail Montes MRN: [id number] Date of Birth: 10-19-1980 Referring Provider: Melanie Crazier MD  Encounter Date: 10/15/2015      PT End of Session - 10/15/15 1538    Visit Number 7   Number of Visits 12   Date for PT Re-Evaluation 10/27/15   PT Start Time [id number]   PT Stop Time 1535   PT Time Calculation (min) 48 min   Activity Tolerance Patient tolerated treatment well   Behavior During Therapy [facility] for tasks assessed/performed      Past Medical History:  Diagnosis Date  . Arthritis     Past Surgical History:  Procedure Laterality Date  . BREAST CYST ASPIRATION Left 2016  . FOOT TENDON SURGERY  08/2013   torn tendon  . TUBAL LIGATION  08/2003    There were no vitals filed for this visit.      Subjective Assessment - 10/15/15 1448    Subjective pain is about the same. 6-7/10 in upper back and lower cervical spine.    Limitations Sitting;Reading;Lifting;Standing;House hold activities;Other (comment)   Patient Stated Goals to get relief of burning in back so she can play sports, sit at desk without hurting    Currently in Pain? Yes   Pain Score 7    Pain Location Back   Pain Orientation Mid;Upper   Pain Descriptors / Indicators Burning;Aching   Pain Type Chronic pain   Pain Onset More than a month ago   Pain Frequency Intermittent     Objective; Palpation; (+) moderate spasms with increased tenderness along right side cervical spine and thoracic spine with point tenderness along spinous processes lower cervical spine and thoracic spine Special tests; Cervical spine compression (-), distraction (-), Spurlings (-) for any reproduction or relief of burning/pain in cervical/thoracic spine  Treatment: Manual therapy techniques: Joint mobilization:   Patient seated thoracic spine mobilization PA glides grade 2-3 x 5 sets each level Patient prone lying with face supported in cradle: PA mobs. Grade 1-2 along cervical spine into thoracic spine all tender and no change in pain/ tenderness reported with repetition Soft tissue mobilization/manipulation; Cervical spine, suboccipital muscles. And along right side upper trapeizus, rhomboids and lats., paraspinal muslces superficial techniques to decrease pain, spasms  Modalities:  High volt electrical stimulation; Patient prone lying: applied (4) electrodes: (2) on either side lower cervical spine/upper trapezius and (2) along right side thoracic spine mid and lower levels over palpable  Spasms: muscle spasm clinical protocol intensity to tolerance/comfort: applied moist heat to same x 20 min.  Patient response to treatment: no adverse reaction noted to heat/estim., decreased spasms and tenderness following STM to spasms and decreased pain reported following estim/moist heat 7/10 to 4/10. Patient demonstrated good understanding of use of heat, exercise to control spasms and pain        PT Education - 10/15/15 1533    Education provided Yes   Education Details educated in muscle spasms and chronic pain, spasms which limit ability to strengthen muscles effectively therefore will focus on decreasing pain, spasms to balance strength/mobility   Person(s) Educated Patient   Methods Explanation;Tactile cues   Comprehension Verbalized understanding             PT Long Term Goals - 09/15/15 1825      PT LONG TERM GOAL #1  Title Patient will demonstrate decreased pain, improved function with UE's and be able to sit at computer >30 min. without increased back pain as noted with MODI score of 30% or better by 10/27/2015   Baseline MODI 44% (moderate self perceived disability   Status New     PT LONG TERM GOAL #2   Title Patient will be independent without cuing for home program for pain  control, exercise progression once discharged from physical therapy to continue improvement and self managment by 10/27/2015   Baseline Patient requires moderate cuing and has limited to no knowledge of appropriate exercises and progression to improve pain/strength   Status New               Plan - 10/15/15 1534    Clinical Impression Statement Focus of therapy session was on reduction of spasms and pain today to allow patient to effectively exercise and strengthen muslce to decrease pain and improve function. She responded well to [facility] and electrical stimulation and should continue to improve with additional treament.    Rehab Potential Good   PT Frequency 2x / week   PT Duration 6 weeks   PT Treatment/Interventions Patient/family education;Electrical Stimulation;Neuromuscular re-education;Cryotherapy;Moist Heat;Dry needling;Therapeutic exercise;Ultrasound;Manual techniques;Taping   PT Next Visit Plan progress exercises, pain control, modalities as indicated, manual STM   PT Home Exercise Plan spasm/pain control, scapular retraction      Patient will benefit from skilled therapeutic intervention in order to improve the following deficits and impairments:  Postural dysfunction, Decreased strength, Pain, Decreased endurance, Impaired UE functional use  Visit Diagnosis: Other muscle spasm  Pain in thoracic spine  Pain in left shoulder  Pain in right shoulder  Muscle weakness (generalized)     Problem List Patient Active Problem List   Diagnosis Date Noted  . Breast nodule 10/01/2014  . Breast mass, left 08/21/2014  . Costochondritis 07/01/2014    Jomarie Longs PT 10/16/2015, 12:10 PM  [place] [facility] [address], [place], [address] Phone: [phone]   Fax:  [phone]  Name: GAYLIN HOSBACH MRN: [id number] Date of Birth: 11/05/1980

## 2015-10-20 ENCOUNTER — Encounter: Payer: Self-pay | Admitting: Physical Therapy

## 2015-10-20 ENCOUNTER — Ambulatory Visit: Payer: BLUE CROSS/BLUE SHIELD | Admitting: Physical Therapy

## 2015-10-20 DIAGNOSIS — M62838 Other muscle spasm: Secondary | ICD-10-CM

## 2015-10-20 DIAGNOSIS — M546 Pain in thoracic spine: Secondary | ICD-10-CM

## 2015-10-20 DIAGNOSIS — M25512 Pain in left shoulder: Secondary | ICD-10-CM

## 2015-10-20 DIAGNOSIS — M25511 Pain in right shoulder: Secondary | ICD-10-CM

## 2015-10-20 NOTE — Therapy (Signed)
[place] [facility] [address] [place], [address] Phone: [phone]   Fax:  [phone]  Physical Therapy Treatment  Patient Details  Name: Abigail Montes MRN: [id number] Date of Birth: 1980/02/18 Referring Provider: Melanie Crazier MD  Encounter Date: 10/20/2015      PT End of Session - 10/20/15 1632    Visit Number 8   Number of Visits 12   Date for PT Re-Evaluation 10/27/15   PT Start Time 1627   PT Stop Time [id number]   PT Time Calculation (min) 48 min   Activity Tolerance Patient tolerated treatment well   Behavior During Therapy [facility] for tasks assessed/performed      Past Medical History:  Diagnosis Date  . Arthritis     Past Surgical History:  Procedure Laterality Date  . BREAST CYST ASPIRATION Left 2016  . FOOT TENDON SURGERY  08/2013   torn tendon  . TUBAL LIGATION  08/2003    There were no vitals filed for this visit.      Subjective Assessment - 10/20/15 1629    Subjective "My back is bothering me" from base of neck to below shoulder blades.    Limitations Sitting;Reading;Lifting;Standing;House hold activities;Other (comment)   Patient Stated Goals to get relief of burning in back so she can play sports, sit at desk without hurting    Currently in Pain? Yes   Pain Score 7    Pain Location Back   Pain Orientation Mid;Upper   Pain Descriptors / Indicators Sore;Burning;Nagging   Pain Type Chronic pain   Pain Onset More than a month ago   Pain Frequency Intermittent      Objective; Palpation; (+) spasms along cervical spine into thoracic spine right side>left   Treatment: Manual therapy techniques: Patient prone lying with face supported in cradle: PA mobs. Grade 1-2 along cervical spine into thoracic spine all tender and decreased pain/ tenderness reported with repetition Soft tissue mobilization/manipulation; Cervical spine, suboccipital muscles. And along right side upper  trapeizus, rhomboids and lats., paraspinal muslces superficial techniques to decrease pain, spasms  Modalities:  High volt electrical stimulation; Patient prone lying: applied (4) electrodes:  along right and left side thoracic spine mid and lower levels over palpable  Spasms: muscle spasm clinical protocol intensity to tolerance/comfort: applied moist heat to same x 20 min.  Patient response to treatment: no adverse reaction noted to heat/estim., decreased spasms to mild, pain level to 2-3/10 following STM, estim. And moist heat.           PT Education - 10/20/15 1700    Education provided Yes   Education Details re inforced exercises to be done at home and the need to continue reducing pain and spasms in order to be able to further strengthen   Person(s) Educated Patient   Methods Explanation   Comprehension Verbalized understanding             PT Long Term Goals - 09/15/15 1825      PT LONG TERM GOAL #1   Title Patient will demonstrate decreased pain, improved function with UE's and be able to sit at computer >30 min. without increased back pain as noted with MODI score of 30% or better by 10/27/2015   Baseline MODI 44% (moderate self perceived disability   Status New     PT LONG TERM GOAL #2   Title Patient will be independent without cuing for home program for pain control, exercise progression once discharged  from physical therapy to continue improvement and self managment by 10/27/2015   Baseline Patient requires moderate cuing and has limited to no knowledge of appropriate exercises and progression to improve pain/strength   Status New               Plan - 10/20/15 1724    Clinical Impression Statement continued to focus on mobilizaiton and reduction of pain/spasms with good results and 50% decreased pain at end of session. She should continue to improve with mobility with self stretching/mobilization as instructed.    Rehab Potential Good   PT Frequency 2x  / week   PT Duration 6 weeks   PT Treatment/Interventions Patient/family education;Electrical Stimulation;Neuromuscular re-education;Cryotherapy;Moist Heat;Dry needling;Therapeutic exercise;Ultrasound;Manual techniques;Taping   PT Next Visit Plan progress exercises, pain control, modalities as indicated, manual STM   PT Home Exercise Plan spasm/pain control, scapular retraction      Patient will benefit from skilled therapeutic intervention in order to improve the following deficits and impairments:  Postural dysfunction, Decreased strength, Pain, Decreased endurance, Impaired UE functional use  Visit Diagnosis: Other muscle spasm  Pain in thoracic spine  Pain in left shoulder  Pain in right shoulder     Problem List Patient Active Problem List   Diagnosis Date Noted  . Breast nodule 10/01/2014  . Breast mass, left 08/21/2014  . Costochondritis 07/01/2014    Jomarie Longs PT 10/21/2015, 9:53 PM  [facility] [facility] [address], [place], [address] Phone: [phone]   Fax:  [phone]  Name: Abigail Montes MRN: [id number] Date of Birth: 10/09/1980

## 2015-10-22 ENCOUNTER — Encounter: Payer: Self-pay | Admitting: Physical Therapy

## 2015-10-22 ENCOUNTER — Ambulatory Visit: Payer: BLUE CROSS/BLUE SHIELD | Admitting: Physical Therapy

## 2015-10-22 DIAGNOSIS — M546 Pain in thoracic spine: Secondary | ICD-10-CM | POA: Diagnosis not present

## 2015-10-22 DIAGNOSIS — M6281 Muscle weakness (generalized): Secondary | ICD-10-CM

## 2015-10-22 DIAGNOSIS — M25512 Pain in left shoulder: Secondary | ICD-10-CM

## 2015-10-22 DIAGNOSIS — M62838 Other muscle spasm: Secondary | ICD-10-CM

## 2015-10-22 DIAGNOSIS — M25511 Pain in right shoulder: Secondary | ICD-10-CM

## 2015-10-23 NOTE — Therapy (Signed)
[place] [facility] [address] [place], [address] Phone: [phone]   Fax:  [phone]  Physical Therapy Treatment/Discharge Summary  Patient Details  Name: Abigail Montes MRN: [id number] Date of Birth: [birth year] Referring Provider: Melanie Crazier MD  Encounter Date: 10/22/2015   Patient began physical therapy 09/15/2015  And attended 9 sessions through 10/22/2015 with no lasting results of pain relief in back pain. She continues with burning sensation and pain that limits function with daily tasks requiring sitting, standing and using UE's. She is independent with home program for pain control and exercises for strengthening. Recommend return to MD for follow up . Plan: discharge from physical therapy at this time.      PT End of Session - 10/22/15 1449    Visit Number 9   Number of Visits 12   Date for PT Re-Evaluation 10/27/15   PT Start Time 2202   PT Stop Time 1530   PT Time Calculation (min) 43 min   Activity Tolerance Patient tolerated treatment well   Behavior During Therapy [facility] for tasks assessed/performed      Past Medical History:  Diagnosis Date  . Arthritis     Past Surgical History:  Procedure Laterality Date  . BREAST CYST ASPIRATION Left 2016  . FOOT TENDON SURGERY  08/2013   torn tendon  . TUBAL LIGATION  08/2003    There were no vitals filed for this visit.      Subjective Assessment - 10/22/15 1448    Subjective Patient reports feet are bothering her more today. Back is about the same. She has achieved only temporary relief of back symptoms between therapy sessions with same pain returning and no long lasting effects. She reports she understands exercises she can continue to strengthen her back and assist with pain control.    Limitations Sitting;Reading;Lifting;Standing;House hold activities;Other (comment)   Patient Stated Goals to get relief of burning in back so she can play  sports, sit at desk without hurting    Currently in Pain? Yes   Pain Score 7    Pain Orientation Mid;Upper   Pain Descriptors / Indicators Burning;Nagging;Aching   Pain Type Chronic pain   Pain Onset More than a month ago   Pain Frequency Constant   Aggravating Factors  sitting, standing, lifting: no real change in symtpoms ro ability to sit or stand any longer than prior to beginning therapy   Pain Relieving Factors heat, stretching, exercises with only temporary relief      Objective: AROM:  cervical spine: flexion, extension rotations and lateral flexion all WNL's without reproduction of symptoms UE's: both shoulders WNL's all planes without reproduction of symptoms Strength:UE's and periscapular muscles WFL's without reproduction of symptoms Posture: WNL Joint mobility: thoracic spine mildly limited, improved from previous session Palpation: + spasms along bilateral thoracic spine with + tenderness elicited, mild as compared to previous session Outcome measure: modified oswestry back pain questionnaire 40% (was 44% demonstrating no significant change with therapy)   Treatment: Manual therapy techniques: Patient prone lying with face supported in cradle:  Soft tissue mobilization/manipulation; Cervical spine, suboccipital muscles. And along right side upper trapeizus, rhomboids and lats., paraspinal muslces superficial techniques to decrease pain, spasms  Modalities:  High volt electrical stimulation; Patient prone lying: applied (4) electrodes:  along right and left side thoracic spine mid and lower levels over palpable Spasms: muscle spasm clinical protocol intensity to tolerance/comfort: applied moist heat to same x 20 min.  Patient response to treatment: No adverse reaction to moist heat, estim. Mild spasms palpable along bilateral thoracic,  Lumbar spine with continued reported tenderness. Decreased pain reported by 50% following heat/stim. Treatment.She verbalized good  understanding of home program         PT Education - 10/22/15 1530    Education provided Yes   Education Details HEP re inforced for discharge   Person(s) Educated Patient   Methods Explanation   Comprehension Verbalized understanding             PT Long Term Goals - 10/23/15 1950      PT LONG TERM GOAL #1   Title Patient will demonstrate decreased pain, improved function with UE's and be able to sit at computer >30 min. without increased back pain as noted with MODI score of 30% or better by 10/27/2015   Baseline MODI 44% (moderate self perceived disability); current 40%: no significant changes noted with therapy intervention, continues with pain and limitations with sitting and movement   Status Not Met     PT LONG TERM GOAL #2   Title Patient will be independent without cuing for home program for pain control, exercise progression once discharged from physical therapy to continue improvement and self managment by 10/27/2015   Baseline Patient requires moderate cuing and has limited to no knowledge of appropriate exercises and progression to improve pain/strength   Status Achieved               Plan - 10/22/15 1530    Clinical Impression Statement Patient has achieved improved knowledge of appropriate posture awareness, exercises to strengthen and improve flexibility in upper body/periscapular muscles and will continue with independent exercise to prepare for anticipated surgery. She has not demonstrated lasting results of pain relief in back with sitting, standing and activity and will be discharged from therapy at this time for follow up with MD .   Rehab Potential Good   PT Frequency 2x / week   PT Duration 6 weeks   PT Treatment/Interventions Patient/family education;Electrical Stimulation;Neuromuscular re-education;Cryotherapy;Moist Heat;Dry needling;Therapeutic exercise;Ultrasound;Manual techniques;Taping   PT Next Visit Plan discharge from physical therapy     PT Home Exercise Plan As instructed for posture and strengthening   Consulted and Agree with Plan of Care Patient      Patient will benefit from skilled therapeutic intervention in order to improve the following deficits and impairments:  Postural dysfunction, Decreased strength, Pain, Decreased endurance, Impaired UE functional use  Visit Diagnosis: Pain in thoracic spine  Other muscle spasm  Pain in left shoulder  Pain in right shoulder  Muscle weakness (generalized)     Problem List Patient Active Problem List   Diagnosis Date Noted  . Breast nodule 10/01/2014  . Breast mass, left 08/21/2014  . Costochondritis 07/01/2014    Jomarie Longs PT 10/23/2015, 7:55 PM  [facility] [address] [place], [address] Phone: [phone]   Fax:  [phone]  Name: ADELLE ZACHAR MRN: [id number] Date of Birth: [birth year]

## 2015-10-26 ENCOUNTER — Ambulatory Visit: Payer: BLUE CROSS/BLUE SHIELD | Admitting: Physical Therapy

## 2015-10-28 ENCOUNTER — Encounter: Payer: BLUE CROSS/BLUE SHIELD | Admitting: Physical Therapy

## 2015-11-03 ENCOUNTER — Encounter: Payer: BLUE CROSS/BLUE SHIELD | Admitting: Physical Therapy

## 2015-11-05 ENCOUNTER — Encounter: Payer: BLUE CROSS/BLUE SHIELD | Admitting: Physical Therapy

## 2015-11-09 ENCOUNTER — Encounter: Payer: BLUE CROSS/BLUE SHIELD | Admitting: Physical Therapy

## 2015-11-11 ENCOUNTER — Encounter: Payer: BLUE CROSS/BLUE SHIELD | Admitting: Physical Therapy

## 2015-11-16 ENCOUNTER — Encounter: Payer: BLUE CROSS/BLUE SHIELD | Admitting: Physical Therapy

## 2015-11-16 ENCOUNTER — Telehealth: Payer: Self-pay | Admitting: Urology

## 2015-11-16 ENCOUNTER — Ambulatory Visit: Payer: BLUE CROSS/BLUE SHIELD | Admitting: Urology

## 2015-11-16 ENCOUNTER — Other Ambulatory Visit: Payer: Self-pay

## 2015-11-16 ENCOUNTER — Encounter: Payer: Self-pay | Admitting: Urology

## 2015-11-16 VITALS — BP 148/81 | HR 71 | Ht 65.0 in | Wt 207.0 lb

## 2015-11-16 DIAGNOSIS — R109 Unspecified abdominal pain: Secondary | ICD-10-CM | POA: Diagnosis not present

## 2015-11-16 DIAGNOSIS — N8110 Cystocele, unspecified: Secondary | ICD-10-CM | POA: Diagnosis not present

## 2015-11-16 DIAGNOSIS — Z87442 Personal history of urinary calculi: Secondary | ICD-10-CM

## 2015-11-16 DIAGNOSIS — N390 Urinary tract infection, site not specified: Secondary | ICD-10-CM

## 2015-11-16 DIAGNOSIS — N3946 Mixed incontinence: Secondary | ICD-10-CM | POA: Diagnosis not present

## 2015-11-16 LAB — MICROSCOPIC EXAMINATION
RBC MICROSCOPIC, UA: NONE SEEN /HPF (ref 0–?)
WBC UA: NONE SEEN /HPF (ref 0–?)

## 2015-11-16 LAB — URINALYSIS, COMPLETE
BILIRUBIN UA: NEGATIVE
Glucose, UA: NEGATIVE
Ketones, UA: NEGATIVE
LEUKOCYTES UA: NEGATIVE
Nitrite, UA: NEGATIVE
PH UA: 5.5 (ref 5.0–7.5)
Protein, UA: NEGATIVE
RBC UA: NEGATIVE
Specific Gravity, UA: <1.005 — ABNORMAL LOW (ref 1.005–1.030)
Urobilinogen, Ur: 0.2 mg/dL (ref 0.2–1.0)

## 2015-11-16 LAB — BLADDER SCAN AMB NON-IMAGING: Scan Result: 145

## 2015-11-16 NOTE — Telephone Encounter (Signed)
Spoke with Lelon Frohlich at [facility] doctor office in reference to needing ucx from last year. Lelon Frohlich stated she would fax them over.

## 2015-11-16 NOTE — Telephone Encounter (Signed)
Please call patient's PCP and request all urine cultures over the last year.

## 2015-11-16 NOTE — Patient Instructions (Signed)
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
## 2015-11-18 ENCOUNTER — Encounter: Payer: BLUE CROSS/BLUE SHIELD | Admitting: Physical Therapy

## 2015-11-18 LAB — CULTURE, URINE COMPREHENSIVE

## 2015-11-19 ENCOUNTER — Ambulatory Visit (INDEPENDENT_AMBULATORY_CARE_PROVIDER_SITE_OTHER): Payer: BLUE CROSS/BLUE SHIELD

## 2015-11-19 ENCOUNTER — Telehealth: Payer: Self-pay

## 2015-11-19 VITALS — BP 144/89 | HR 62

## 2015-11-19 DIAGNOSIS — N390 Urinary tract infection, site not specified: Secondary | ICD-10-CM

## 2015-11-19 DIAGNOSIS — R300 Dysuria: Secondary | ICD-10-CM

## 2015-11-19 LAB — URINALYSIS, COMPLETE
BILIRUBIN UA: NEGATIVE
KETONES UA: NEGATIVE
Leukocytes, UA: NEGATIVE
Nitrite, UA: NEGATIVE
PROTEIN UA: NEGATIVE
RBC UA: NEGATIVE
SPEC GRAV UA: 1.025 (ref 1.005–1.030)
UUROB: 1 mg/dL (ref 0.2–1.0)
pH, UA: 6 (ref 5.0–7.5)

## 2015-11-19 LAB — MICROSCOPIC EXAMINATION: BACTERIA UA: NONE SEEN

## 2015-11-19 NOTE — Progress Notes (Addendum)
  The pt complaining of urinary frequency, burning, and urinary leakage x 3 days. She been taking a lot of AZO and was wondering if there was anything else she can take.    In and Out Catheterization  Patient is present today for a I & O catheterization due to dysuria, positive urine culture. Patient was cleaned and prepped in a sterile fashion with betadine and Lidocaine 2% jelly was instilled into the urethra.  A 14 FR cath was inserted no complications were noted , 30 ml of urine return was noted, urine was yellow in color. A clean urine sample was collected for urine cx.. Bladder was drained  And catheter was removed with out difficulty.    Preformed by: K.Caroline Matters,CMA

## 2015-11-19 NOTE — Telephone Encounter (Signed)
Spoke with pt in reference to +ucx and cath specimen. Pt was added to nurse schedule today at 3pm.

## 2015-11-19 NOTE — Telephone Encounter (Signed)
-----   Message from Nori Riis, PA-C sent at 11/18/2015  9:05 PM EDT ----- Patient's urine culture is positive, but it shows signs of possible contamination.  I would like to obtain a CATH UA for microscopic analysis and culture.  I do not want to start any antibiotics at this time as the only oral antibiotic the organism is sensitive to is Macrobid and she is allergic to that medication.  The other antibiotics would require a PICC line insertion and we don't order those unless it is a CATH specimen that has infections as PICC lines are invasive.

## 2015-11-23 ENCOUNTER — Ambulatory Visit: Payer: BLUE CROSS/BLUE SHIELD | Admitting: Podiatry

## 2015-11-24 LAB — CULTURE, URINE COMPREHENSIVE

## 2015-11-25 ENCOUNTER — Telehealth: Payer: Self-pay

## 2015-11-25 NOTE — Telephone Encounter (Signed)
-----   Message from Nori Riis, PA-C sent at 11/24/2015 10:21 PM EDT ----- Please notify the patient that her urine culture was negative.  Is she scheduled for her CT scan?

## 2015-11-25 NOTE — Telephone Encounter (Signed)
Spoke with pt in reference to ucx results and CT. Pt stated that her CT is scheduled for tomorrow at 3.

## 2015-11-26 ENCOUNTER — Ambulatory Visit
Admission: RE | Admit: 2015-11-26 | Discharge: 2015-11-26 | Disposition: A | Discharge status: Home / Self Care | Payer: BLUE CROSS/BLUE SHIELD | Source: Ambulatory Visit | Attending: Urology | Admitting: Urology

## 2015-11-26 DIAGNOSIS — R109 Unspecified abdominal pain: Secondary | ICD-10-CM | POA: Insufficient documentation

## 2015-11-30 ENCOUNTER — Encounter: Payer: Self-pay | Admitting: Urology

## 2015-11-30 ENCOUNTER — Ambulatory Visit (INDEPENDENT_AMBULATORY_CARE_PROVIDER_SITE_OTHER): Payer: BLUE CROSS/BLUE SHIELD | Admitting: Urology

## 2015-11-30 VITALS — BP 146/81 | HR 71 | Ht 65.0 in | Wt 207.6 lb

## 2015-11-30 DIAGNOSIS — R10A2 Flank pain, left side: Secondary | ICD-10-CM

## 2015-11-30 DIAGNOSIS — Z87440 Personal history of urinary (tract) infections: Secondary | ICD-10-CM | POA: Diagnosis not present

## 2015-11-30 DIAGNOSIS — N8110 Cystocele, unspecified: Secondary | ICD-10-CM | POA: Diagnosis not present

## 2015-11-30 DIAGNOSIS — Z87442 Personal history of urinary calculi: Secondary | ICD-10-CM

## 2015-11-30 DIAGNOSIS — R109 Unspecified abdominal pain: Secondary | ICD-10-CM

## 2015-11-30 DIAGNOSIS — N3946 Mixed incontinence: Secondary | ICD-10-CM | POA: Diagnosis not present
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
## 2015-12-11 ENCOUNTER — Telehealth: Payer: Self-pay | Admitting: *Deleted

## 2015-12-11 MED ORDER — GABAPENTIN 300 MG PO CAPS: 300.0000 mg | ORAL_CAPSULE | Freq: Three times a day (TID) | ORAL | 2 refills | Status: DC

## 2015-12-11 NOTE — Telephone Encounter (Signed)
Refill request for GAbapentin 300mg  #270 one capsule tid. Dr. Marta Antu refill +2.

## 2016-01-01 ENCOUNTER — Ambulatory Visit: Payer: BLUE CROSS/BLUE SHIELD

## 2016-01-28 ENCOUNTER — Other Ambulatory Visit: Payer: Self-pay | Admitting: Plastic Surgery

## 2016-07-25 IMAGING — CR DG CHEST 2V
1 series · 2 of 2 positions shown · non-contrast
Comparison: None.

CLINICAL DATA: Cough, shortness of breath

EXAM:
CHEST  2 VIEW

[Series 1: dxr chest pa (or ap) and lateral · 0.14mm/px · 2 of 2 slices shown]
[im 1/2]
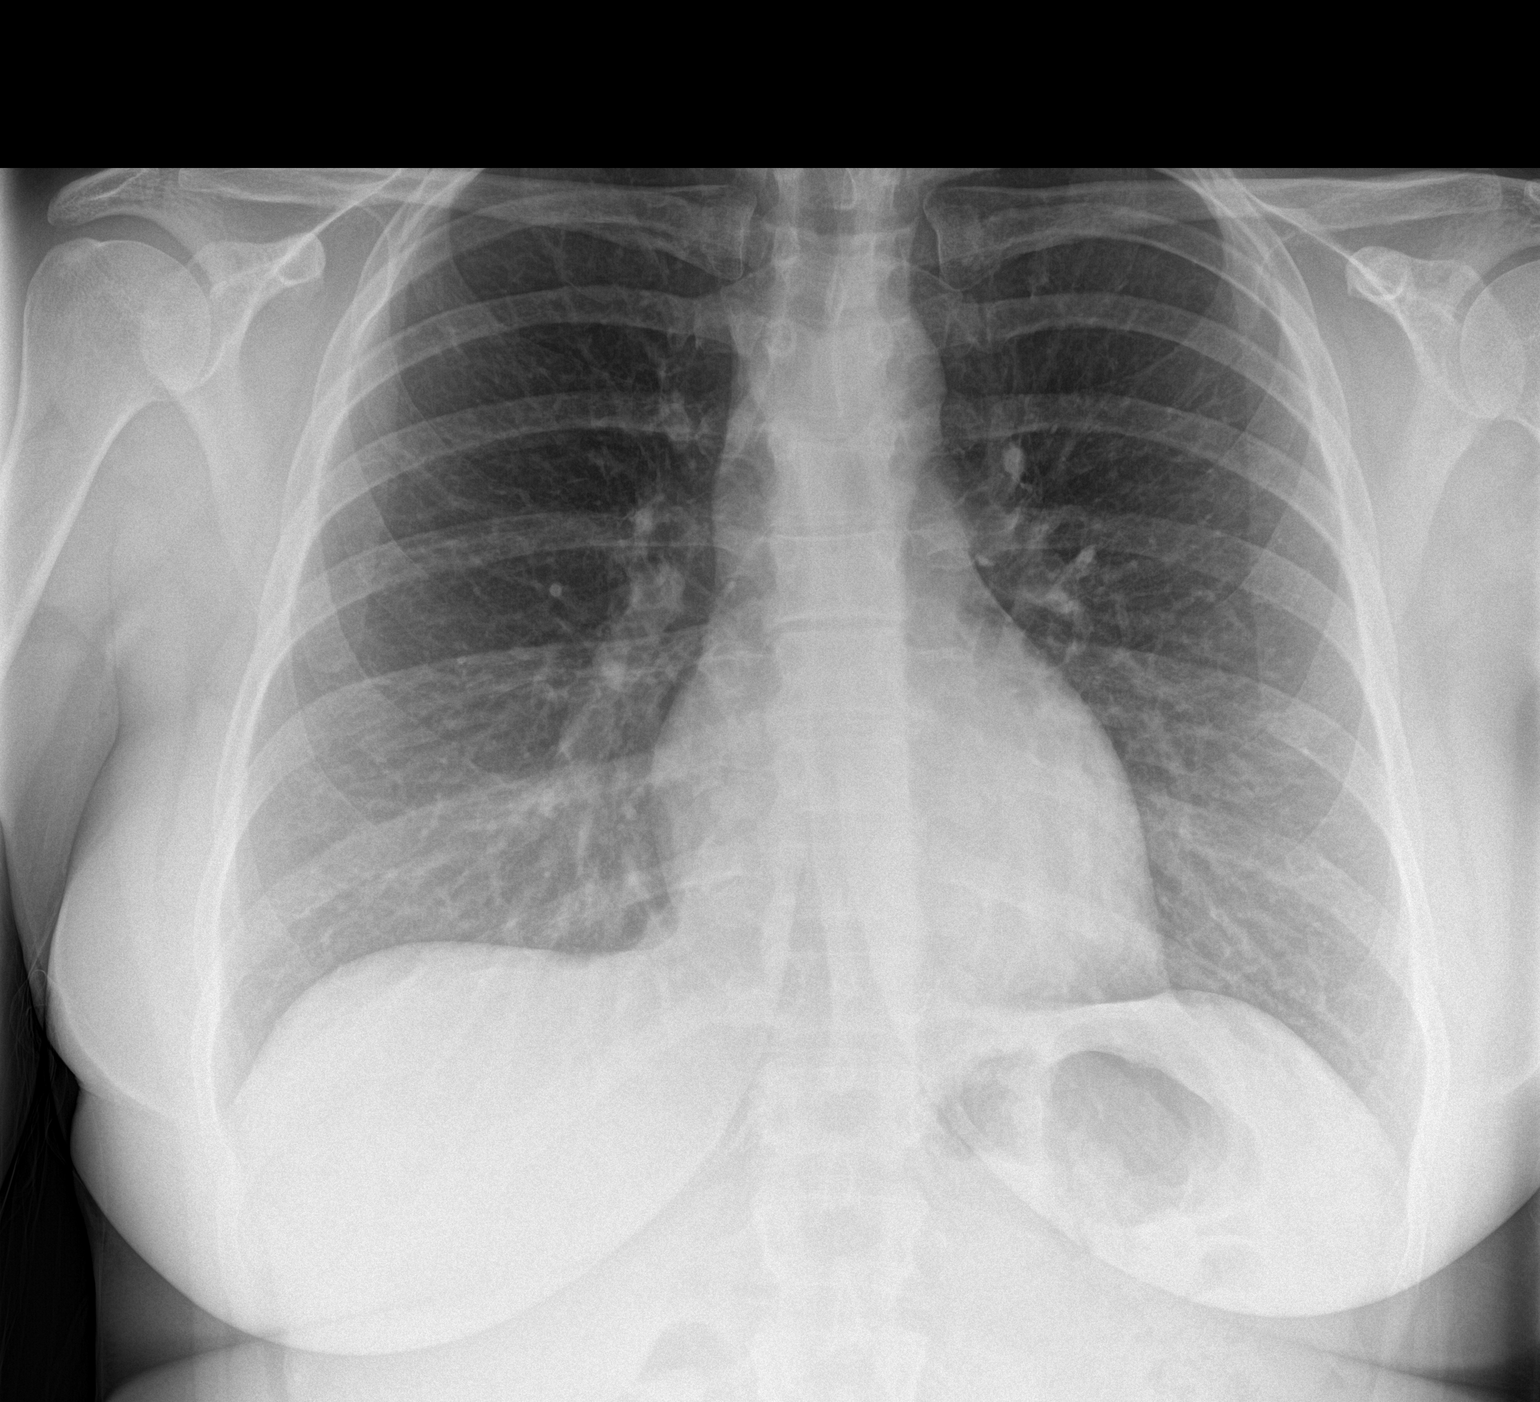
[im 2/2]
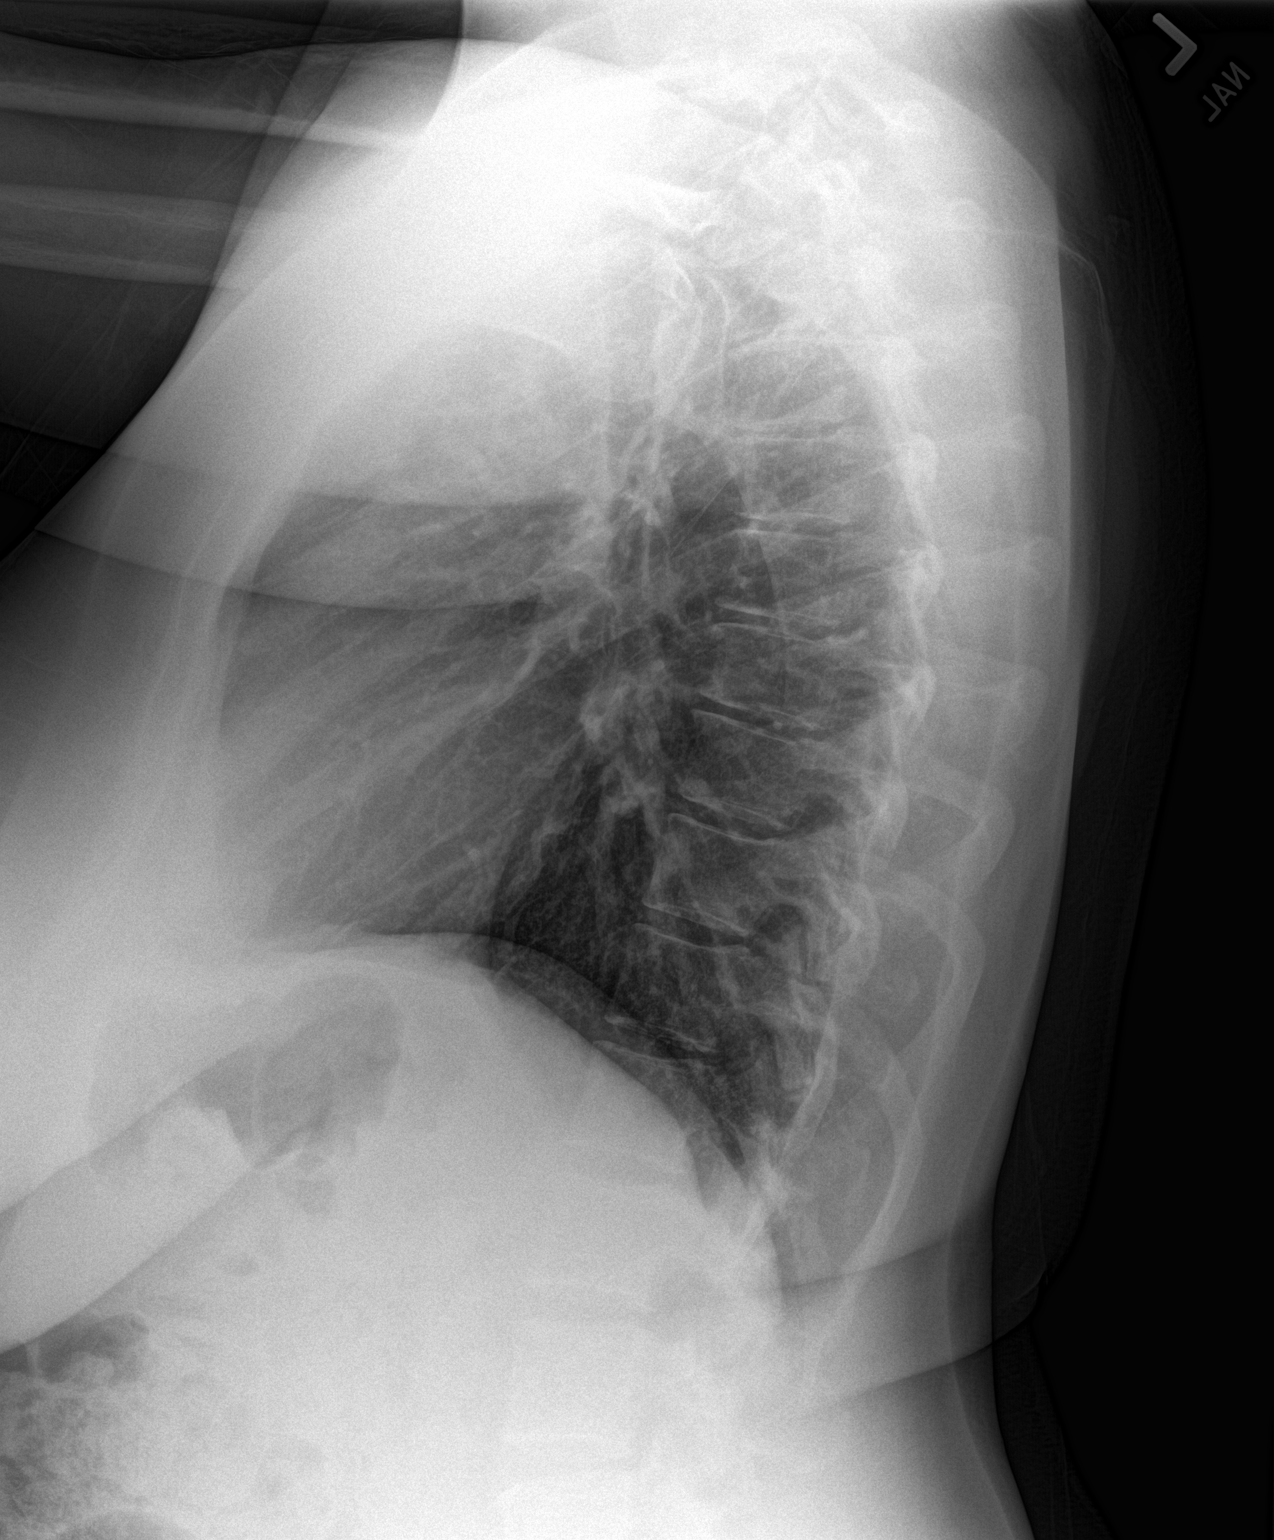

[2 of 2 positions shown; findings below may reference images not displayed]

FINDINGS: Lungs are clear.  No pleural effusion or pneumothorax.

The heart is normal in size.

Visualized osseous structures are within normal limits.
IMPRESSION: Normal chest radiographs.

## 2016-07-26 ENCOUNTER — Ambulatory Visit (INDEPENDENT_AMBULATORY_CARE_PROVIDER_SITE_OTHER): Payer: BLUE CROSS/BLUE SHIELD

## 2016-07-26 ENCOUNTER — Ambulatory Visit (INDEPENDENT_AMBULATORY_CARE_PROVIDER_SITE_OTHER): Payer: BLUE CROSS/BLUE SHIELD | Admitting: Podiatry

## 2016-07-26 DIAGNOSIS — M79671 Pain in right foot: Secondary | ICD-10-CM

## 2016-07-26 DIAGNOSIS — M722 Plantar fascial fibromatosis: Secondary | ICD-10-CM

## 2016-07-26 DIAGNOSIS — M79672 Pain in left foot: Secondary | ICD-10-CM

## 2016-07-26 MED ORDER — DICLOFENAC SODIUM 75 MG PO TBEC: 75.0000 mg | DELAYED_RELEASE_TABLET | Freq: Two times a day (BID) | ORAL | 0 refills | Status: DC

## 2016-07-26 MED ORDER — METHYLPREDNISOLONE 4 MG PO TBPK: ORAL_TABLET | ORAL | 0 refills | Status: DC

## 2016-07-27 NOTE — Progress Notes (Signed)
   Subjective: Patient presents today for burning, throbbing, aching pain and tenderness in the feet bilaterally onset two months ago. Patient states that it hurts in the mornings with the first steps out of bed. Sitting for long periods of time increases the pain. She has taken  Patient presents today for further treatment and evaluation  Objective: Physical Exam General: The patient is alert and oriented x3 in no acute distress.  Dermatology: Skin is warm, dry and supple bilateral lower extremities. Negative for open lesions or macerations bilateral.   Vascular: Dorsalis Pedis and Posterior Tibial pulses palpable bilateral.  Capillary fill time is immediate to all digits.  Neurological: Epicritic and protective threshold intact bilateral.   Musculoskeletal: Tenderness to palpation at the medial calcaneal tubercale and through the insertion of the plantar fascia of the bilateral feet. All other joints range of motion within normal limits bilateral. Strength 5/5 in all groups bilateral.   Radiographic exam: Normal osseous mineralization. Joint spaces preserved. No fracture/dislocation/boney destruction. Calcaneal spur present with mild thickening of plantar fascia bilateral. No other soft tissue abnormalities or radiopaque foreign bodies.   Assessment: #1 h/o right foot plantar fasciitis surgery x 2 years ago #2 h/o left foot plantar fasciitis surgery x 1 year ago #3 plantar fasciitis bilaterally  Plan of Care:  1. Patient evaluated. Xrays reviewed.   2. Injection of 0.5cc Celestone soluspan injected into the bilateral heels.  3. Instructed patient regarding therapies and modalities at home to alleviate symptoms.  4. Rx for Diclofenac 75 mg PO given to patient.  5. Rx for Medrol Dose Pak given to patient.  6. Plantar fascial band(s) dispensed for bilateral plantar fasciitis. 7. Pt scanned for custom molded orthotics but decided she cannot afford them at the moment.  8. Return to  clinic in 4 weeks.    Edrick Kins, DPM [facility]  Dr. Edrick Kins, [place]                                        [place], [place] [address]                Office [phone]  Fax [phone]

## 2016-07-30 MED ORDER — BETAMETHASONE SOD PHOS & ACET 6 (3-3) MG/ML IJ SUSP: 3.0000 mg | Freq: Once | INTRAMUSCULAR | Status: DC

## 2016-08-23 ENCOUNTER — Encounter: Payer: Self-pay | Admitting: Podiatry

## 2016-08-23 ENCOUNTER — Ambulatory Visit (INDEPENDENT_AMBULATORY_CARE_PROVIDER_SITE_OTHER): Payer: BLUE CROSS/BLUE SHIELD | Admitting: Podiatry

## 2016-08-23 DIAGNOSIS — G5751 Tarsal tunnel syndrome, right lower limb: Secondary | ICD-10-CM

## 2016-08-23 DIAGNOSIS — G5752 Tarsal tunnel syndrome, left lower limb: Secondary | ICD-10-CM

## 2016-08-23 NOTE — Progress Notes (Addendum)
   HPI: 36 year old female presents today for follow-up evaluation of what is thought to be plantar fasciitis to the bilateral feet. Patient has a history of EPF surgery to the bilateral feet. Patient continues to experience severe pain and tenderness and she is unable to walk for long periods of time. She states that after walking when resting her feet, they continue to ache. Patient is currently taking gabapentin 300 mg TID for Idiopathic neuropathy with minimal relief. Patient states that the injection she received last time to the bilateral heels did not help. She completed the Medrol Dosepak and was taking diclofenac with no alleviation of symptoms. She believes the symptoms are actually worse. Patient also believes the plantar fascial braces made the pain worse.  Unfortunately the patient has been dealing with this chronic severe pain times several years. It is apparent that she is very frustrated and feels somewhat defeated regarding her foot pain. Patient presents today for further treatment and evaluation     Physical Exam: General: The patient is alert and oriented x3 in no acute distress.  Dermatology: Skin is warm, dry and supple bilateral lower extremities. Negative for open lesions or macerations.  Vascular: Palpable pedal pulses bilaterally. No edema or erythema noted. Capillary refill within normal limits.  Neurological: Epicritic and protective threshold grossly intact bilaterally.   Musculoskeletal Exam: Range of motion within normal limits to all pedal and ankle joints bilateral. Muscle strength 5/5 in all groups bilateral.  Today more focus was toward additional pathology which may be contributing to heel pain. New clinical finding of percussion of the tibial nerve eliciting significant pain extending down to the feet with a positive Tinel sign, bilateral lower extremities  Assessment: 1. Tarsal tunnel syndrome bilateral, right greater than left   Plan of Care:  1. Patient  was evaluated.X-rays taken last visit were reviewed again which are noncontributory to any additional findings other than plantar heel spurs which are likely not the etiology of her pathology  2. Today we are going to order an MRI to the bilateral ankles to visualize the tibial nerve, and any findings relevant to tarsal tunnel syndrome the patient has failed conservative treatment for greater than 1 year without any alleviation of symptoms the patient. MRI warranted to determine if surgical intervention would be of benefit for the patient 3. Immobilization cam boot dispensed the right lower extremity 4. Ankle brace dispensed for the left lower extremity 5. Return to clinic as soon as possible after MRI is performed to discuss results and further treatment    Edrick Kins, DPM [facility]  Dr. Edrick Kins, DPM    [address], [place] [address]                Office [phone]  Fax [phone]

## 2016-08-25 NOTE — Addendum Note (Signed)
Addended by: Graceann Congress D on: 08/25/2016 02:36 PM   Modules accepted: Orders

## 2016-09-14 ENCOUNTER — Telehealth: Payer: Self-pay | Admitting: Podiatry

## 2016-09-14 NOTE — Telephone Encounter (Signed)
Erica with [employer] called about the Pre auth for this patient for a bilateral MRI.  Per Danae Chen, Dr. Amalia Hailey had called and requested another review because the MRI was denied initially. She was asking that we fax over additional medical notes and documentation to support this request to [phone] attn: Appeal Dept.  Records must be received in their office by 09/19/16 in order for this to be reconsidered for prior authorization.  Danae Chen can be reached at [phone] ext: [phone] until 6:00 pm Central time.

## 2016-09-28 ENCOUNTER — Telehealth: Payer: Self-pay

## 2016-09-28 NOTE — Telephone Encounter (Signed)
-----   Message from Edrick Kins, DPM sent at 08/23/2016  7:03 PM EDT ----- Regarding: MRI bilateral heels Please order MRI heels w/ or w/out contrast bilateral lower extremities.  Dx: severe chronic tarsal tunnel syndrome bilateral  Thanks, Dr. Amalia Hailey

## 2016-09-28 NOTE — Telephone Encounter (Signed)
Per [employer]) MRI's were denied, peer to peer was not done and request expired.  Per Rudy Jew rep  with [employer], patient is to call to ask for an appeal.    Patient has been notified of this and she will call [employer] for appeal and let me know if approved or not.  She will call back to schedule appt with Dr. Milinda Pointer.

## 2016-10-04 ENCOUNTER — Ambulatory Visit: Payer: BLUE CROSS/BLUE SHIELD | Admitting: Podiatry

## 2016-10-13 ENCOUNTER — Ambulatory Visit: Payer: BLUE CROSS/BLUE SHIELD | Admitting: Podiatry

## 2016-11-02 ENCOUNTER — Encounter: Payer: Self-pay | Admitting: Podiatry

## 2016-11-02 ENCOUNTER — Ambulatory Visit (INDEPENDENT_AMBULATORY_CARE_PROVIDER_SITE_OTHER): Payer: BLUE CROSS/BLUE SHIELD

## 2016-11-02 ENCOUNTER — Ambulatory Visit (INDEPENDENT_AMBULATORY_CARE_PROVIDER_SITE_OTHER): Payer: BLUE CROSS/BLUE SHIELD | Admitting: Podiatry

## 2016-11-02 DIAGNOSIS — M79672 Pain in left foot: Secondary | ICD-10-CM | POA: Diagnosis not present

## 2016-11-02 DIAGNOSIS — S92345A Nondisplaced fracture of fourth metatarsal bone, left foot, initial encounter for closed fracture: Secondary | ICD-10-CM

## 2016-11-02 MED ORDER — TRAMADOL HCL 50 MG PO TABS: 50.0000 mg | ORAL_TABLET | Freq: Three times a day (TID) | ORAL | 0 refills | Status: DC | PRN

## 2016-11-02 NOTE — Progress Notes (Signed)
She presents today with a chief complaint of pain to the dorsal lateral aspect of the left foot. States that she was leaving on Monday and stepped wrong. She felt a pop with pain and is noticed swelling no ecchymosis. She is barely walk on it.  She currently attends school and presents today with a Cam Walker.  Objective: Vital signs are stable alert and oriented 3. Pulses are palpable. Mild edema overlying the fourth fifth metatarsocuboid articulation area with tenderness on palpation to the cuboid bone and the fourth metatarsal base.  Radiographs do demonstrate what appears to be on lateral view fracture of the base of the fourth metatarsal and possible compression fracture of the cuboid bone.  Assessment possible stress fractures fourth met base and cuboid.  Plan: Encouraged her stay in the boot for another month follow-up with me at that time.

## 2016-11-30 ENCOUNTER — Ambulatory Visit (INDEPENDENT_AMBULATORY_CARE_PROVIDER_SITE_OTHER): Payer: BLUE CROSS/BLUE SHIELD | Admitting: Podiatry

## 2016-11-30 ENCOUNTER — Encounter: Payer: Self-pay | Admitting: Podiatry

## 2016-11-30 ENCOUNTER — Ambulatory Visit (INDEPENDENT_AMBULATORY_CARE_PROVIDER_SITE_OTHER): Payer: BLUE CROSS/BLUE SHIELD

## 2016-11-30 DIAGNOSIS — S92345D Nondisplaced fracture of fourth metatarsal bone, left foot, subsequent encounter for fracture with routine healing: Secondary | ICD-10-CM | POA: Diagnosis not present

## 2016-11-30 MED ORDER — GABAPENTIN 600 MG PO TABS: 600.0000 mg | ORAL_TABLET | Freq: Every day | ORAL | 2 refills | Status: DC

## 2016-11-30 NOTE — Progress Notes (Signed)
She presents today states that her toes are feeling better though she still has pain along the fourth metatarsal base of the left foot. She continues to wear her boot on regular basis.  Objective: Vital signs are stable alert and oriented 3. Pulses are palpable. She still has pain on palpation to the fourth metatarsal base of the left foot. And on motion of the metatarsal from distal to proximal. Radiographs taken today do not demonstrate any change in fracture site which appears to be a stress fracture at the base.  Assessment: Stress fracture of the fourth metatarsal base left.  Plan: All follow-up with her in about 3-4 weeks for another set of x-rays and then consider a different imaging type if we're not improved.

## 2016-12-21 ENCOUNTER — Encounter: Payer: Self-pay | Admitting: Podiatry

## 2016-12-21 ENCOUNTER — Ambulatory Visit (INDEPENDENT_AMBULATORY_CARE_PROVIDER_SITE_OTHER): Payer: BLUE CROSS/BLUE SHIELD

## 2016-12-21 ENCOUNTER — Ambulatory Visit (INDEPENDENT_AMBULATORY_CARE_PROVIDER_SITE_OTHER): Payer: BLUE CROSS/BLUE SHIELD | Admitting: Podiatry

## 2016-12-21 DIAGNOSIS — S92345D Nondisplaced fracture of fourth metatarsal bone, left foot, subsequent encounter for fracture with routine healing: Secondary | ICD-10-CM | POA: Diagnosis not present

## 2016-12-21 NOTE — Progress Notes (Signed)
She presents today for follow-up of stress fracture fourth metatarsal base of the left foot. She states this seems to be doing much better.  Objective: Vital signs are stable alert and oriented 3. Pulses are palpable. No overlying edema. No erythema. Radiograph does demonstrate what appears to be a healing cortical fracture fourth metatarsal base left. She also has pain on palpation and range of motion of this single metatarsal.  Assessment: Resolving stress fracture fourth met base left.  Plan: Continue use the Cam Walker for another 3 weeks ready to be taken at bedtime.

## 2017-01-11 ENCOUNTER — Ambulatory Visit: Payer: BLUE CROSS/BLUE SHIELD | Admitting: Podiatry

## 2017-02-17 ENCOUNTER — Ambulatory Visit (INDEPENDENT_AMBULATORY_CARE_PROVIDER_SITE_OTHER): Payer: BLUE CROSS/BLUE SHIELD

## 2017-02-17 ENCOUNTER — Ambulatory Visit (INDEPENDENT_AMBULATORY_CARE_PROVIDER_SITE_OTHER): Payer: BLUE CROSS/BLUE SHIELD | Admitting: Podiatry

## 2017-02-17 DIAGNOSIS — S99922A Unspecified injury of left foot, initial encounter: Secondary | ICD-10-CM

## 2017-02-17 DIAGNOSIS — S93492A Sprain of other ligament of left ankle, initial encounter: Secondary | ICD-10-CM

## 2017-02-17 DIAGNOSIS — R600 Localized edema: Secondary | ICD-10-CM | POA: Diagnosis not present

## 2017-02-20 ENCOUNTER — Telehealth: Payer: Self-pay | Admitting: Podiatry

## 2017-02-20 NOTE — Telephone Encounter (Signed)
Called pt-requesting for Dr. Milinda Pointer to review her xrays. She fell and thought she might had broken her foot, but when Dr. March Rummage reviewed xrays he said there was not a fracture. Please advise.

## 2017-02-20 NOTE — Telephone Encounter (Signed)
Patient called, stated she was seen last week by Dr. March Rummage in [facility] and she has some questions for Dr. Milinda Pointer about her treatment plan. Would like for Dr. Marisa Cyphers nurse to call her at [phone] or [phone].

## 2017-02-20 NOTE — Telephone Encounter (Signed)
Please call her.  Thanks

## 2017-02-21 ENCOUNTER — Encounter: Payer: Self-pay | Admitting: Podiatry

## 2017-02-21 NOTE — Progress Notes (Signed)
  Subjective:  Patient ID: Abigail Montes, female    DOB: Jul 15, 1980,  MRN: [id number]  Chief Complaint  Patient presents with  . Post-op Problem    will be here at 2:30) left foot pain- immediate attention -foot popped stepped off the porch and twisted the left foot- previously had surgery in the same foot - [facility] PT   37 y.o. female returns for the above complaint.  States that she felt a pop in her left ankle after stepping off the porch twisting her foot.  Objective:  There were no vitals filed for this visit. General AA&O x3. Normal mood and affect.  Vascular Pedal pulses palpable.  Neurologic Epicritic sensation grossly intact.  Dermatologic No open lesions. Skin normal texture and turgor.  Orthopedic:  Pain to palpation along the deltoids, right ATFL L   Assessment & Plan:  Patient was evaluated and treated and all questions answered.  Sprain of left ankle -Radiograph taken and reviewed no acute fractures or dislocations. -Soft cast consisting of Unna boot applied today for pain and inflammation.  Patient advised to wear a Cam walker.  And then transition into a lace up ankle brace.  Advised that patient can remove the Unna boot herself between 3 and 7 days.  Return in about 2 weeks (around 03/03/2017) for Sprain f/u.

## 2017-02-21 NOTE — Telephone Encounter (Signed)
I didn't see anything new when I looked at the xrays.

## 2017-02-21 NOTE — Telephone Encounter (Signed)
Contacted pt-left message with info from Dr. Milinda Pointer regarding xrays

## 2017-02-22 ENCOUNTER — Telehealth: Payer: Self-pay | Admitting: Podiatry

## 2017-02-22 NOTE — Telephone Encounter (Signed)
I'm a pt of Dr. Stephenie Acres and saw Dr. March Rummage last Friday as an emergency. They both looked at my x-rays and told me that my foot was not broken and that I possibly just have a really nasty sprain. So my question is can is would it be okay for me not to wear the boot and wear the brace that Dr. March Rummage said originally was a possibility. If someone would give me a call back and verify that information I would greatly appreciate it. You can reach me at [phone] or [phone]. Thank you.

## 2017-03-08 ENCOUNTER — Ambulatory Visit: Payer: BLUE CROSS/BLUE SHIELD | Admitting: Podiatry

## 2017-03-30 ENCOUNTER — Other Ambulatory Visit: Payer: Self-pay | Admitting: Podiatry

## 2017-06-03 ENCOUNTER — Other Ambulatory Visit: Payer: Self-pay | Admitting: Podiatry

## 2017-07-15 ENCOUNTER — Other Ambulatory Visit: Payer: Self-pay | Admitting: Podiatry

## 2017-08-16 ENCOUNTER — Telehealth: Payer: Self-pay | Admitting: Podiatry

## 2017-08-16 MED ORDER — GABAPENTIN 600 MG PO TABS: 600.0000 mg | ORAL_TABLET | Freq: Every day | ORAL | 5 refills | Status: DC

## 2017-08-16 NOTE — Telephone Encounter (Signed)
Pt states she had seen Dr. March Rummage for only one visit due to an injury and would like to continue with Dr. Milinda Pointer. I reviewed clinicals and on long term medications like Gabapentin, Dr. Milinda Pointer will refill prn with stipulation that pt return to office if any problems. I informed pt of my review and that the medication would be at the [facility].

## 2017-08-16 NOTE — Addendum Note (Signed)
Addended by: Harriett Sine D on: 08/16/2017 03:26 PM   Modules accepted: Orders

## 2017-08-16 NOTE — Telephone Encounter (Signed)
Patient called and left a voicemail about getting a refill from her pharmacy. She said she called the pharmacy to refill it and they sent it and it was denied so she's calling to check on why it was denied. Please call patient back at [phone]

## 2017-08-16 NOTE — Telephone Encounter (Signed)
Left message informing pt Dr. March Rummage had wanted to see her 2 weeks after the last appt, and our protocol required an appt to be scheduled prior to refilling a medication in these cases.

## 2017-09-08 ENCOUNTER — Other Ambulatory Visit: Payer: Self-pay | Admitting: Podiatry

## 2017-09-19 ENCOUNTER — Other Ambulatory Visit: Payer: Self-pay

## 2017-09-19 MED ORDER — GABAPENTIN 600 MG PO TABS: 600.0000 mg | ORAL_TABLET | Freq: Every day | ORAL | 2 refills | Status: DC

## 2017-09-19 NOTE — Telephone Encounter (Signed)
Pharmacy refill request for 90 day supply of Gabapentin 600mg .  Per Dr. Milinda Pointer verbal order, ok to refill.  New script has been sent to pharmacy

## 2018-01-03 ENCOUNTER — Other Ambulatory Visit: Payer: Self-pay | Admitting: Certified Nurse Midwife

## 2018-01-03 DIAGNOSIS — Z1231 Encounter for screening mammogram for malignant neoplasm of breast: Secondary | ICD-10-CM

## 2018-01-19 ENCOUNTER — Other Ambulatory Visit: Payer: Self-pay

## 2018-01-19 ENCOUNTER — Encounter
Admission: RE | Admit: 2018-01-19 | Discharge: 2018-01-19 | Disposition: A | Discharge status: Home / Self Care | Payer: Managed Care, Other (non HMO) | Source: Ambulatory Visit | Attending: Obstetrics & Gynecology | Admitting: Obstetrics & Gynecology

## 2018-01-19 DIAGNOSIS — Z01812 Encounter for preprocedural laboratory examination: Secondary | ICD-10-CM | POA: Insufficient documentation

## 2018-01-19 HISTORY — DX: Nausea with vomiting, unspecified: R11.2

## 2018-01-19 HISTORY — DX: Other complications of anesthesia, initial encounter: T88.59XA

## 2018-01-19 HISTORY — DX: Adverse effect of unspecified anesthetic, initial encounter: T41.45XA

## 2018-01-19 HISTORY — DX: Other specified postprocedural states: Z98.890

## 2018-01-19 LAB — CBC
HEMATOCRIT: 35 % — AB (ref 36.0–46.0)
HEMOGLOBIN: 10.6 g/dL — AB (ref 12.0–15.0)
MCH: 24.3 pg — ABNORMAL LOW (ref 26.0–34.0)
MCHC: 30.3 g/dL (ref 30.0–36.0)
MCV: 80.3 fL (ref 80.0–100.0)
NRBC: 0 % (ref 0.0–0.2)
Platelets: 442 10*3/uL — ABNORMAL HIGH (ref 150–400)
RBC: 4.36 MIL/uL (ref 3.87–5.11)
RDW: 17.5 % — ABNORMAL HIGH (ref 11.5–15.5)
WBC: 8.6 10*3/uL (ref 4.0–10.5)

## 2018-01-19 LAB — TYPE AND SCREEN
ABO/RH(D): A POS
ANTIBODY SCREEN: NEGATIVE

## 2018-01-19 NOTE — Patient Instructions (Signed)
  Your procedure is scheduled on: Friday January 26, 2018 Report to Same Day Surgery 2nd floor [facility] Entrance-take elevator on left to 2nd floor.  Check in with surgery information desk.) To find out your arrival time, call [phone] 1:00-3:00 PM on Thursday January 25, 2018  Remember: Instructions that are not followed completely may result in serious medical risk, up to and including death, or upon the discretion of your surgeon and anesthesiologist your surgery may need to be rescheduled.    __x__ 1. Do not eat food (including mints, candies, chewing gum) after midnight the night before your procedure. You may drink clear liquids up to 2 hours before you are scheduled to arrive at the hospital for your procedure.  Do not drink anything within 2 hours of your scheduled arrival to the hospital.  Approved clear liquids:  --Water or Apple juice without pulp  --Clear carbohydrate beverage such as Gatorade or Powerade  --Black Coffee or Clear Tea (No milk, no creamers, do not add anything to the coffee or tea)    __x__ 2. No Alcohol for 24 hours before or after surgery.   __x__ 3. No Smoking or e-cigarettes for 24 hours before surgery.  Do not use any chewable tobacco products for at least 6 hours before surgery.   __x__ 4. Notify your doctor if there is any change in your medical condition (cold, fever, infections).   __x__ 5. On the morning of surgery brush your teeth with toothpaste and water.  You may rinse your mouth with mouthwash if you wish.  Do not swallow any toothpaste or mouthwash.  Please read over the following fact sheets that you were given:   [facility] Preparing for Surgery and/or MRSA Information    __x__ Use CHG Soap or Sage wipes as directed on instruction sheet   Do not wear jewelry, make-up, hairpins, clips or nail polish on the day of surgery.  Do not wear lotions, powders, deodorant, or perfumes.   Do not shave below the face/neck 48 hours  prior to surgery.   Do not bring valuables to the hospital.    [facility] is not responsible for any belongings or valuables.    Leave your suitcase in the car. After surgery it may be brought to your room.  For patients admitted to the hospital, discharge time is determined by your treatment team.  For patients discharged on the day of surgery, you will NOT be permitted to drive yourself home.  You must have a responsible adult with you for 24 hours after surgery.  __x__ Take these medicines on the morning of surgery with a SMALL SIP OF WATER:  1. Omeprazole (Prilosec)  ____ Follow recommendations from Cardiologist, Pulmonologist or PCP regarding stopping Aspirin, Coumadin, Plavix, Eliquis, Effient, Pradaxa, and Pletal.  __x__ TODAY: Stop Anti-inflammatories such as Advil, Ibuprofen, Motrin, Aleve, Naproxen, Naprosyn, BC/Goodies powders or aspirin products. You may continue to take Tylenol and Celebrex.   __x__ TODAY: Avoid supplements until after surgery. You may continue to take Vitamin D, Vitamin B, and multivitamin.

## 2018-01-26 ENCOUNTER — Ambulatory Visit: Payer: Managed Care, Other (non HMO) | Admitting: Certified Registered"

## 2018-01-26 ENCOUNTER — Ambulatory Visit
Admission: RE | Admit: 2018-01-26 | Discharge: 2018-01-26 | Disposition: A | Discharge status: Home / Self Care | Payer: Managed Care, Other (non HMO) | Attending: Obstetrics & Gynecology | Admitting: Obstetrics & Gynecology

## 2018-01-26 ENCOUNTER — Encounter
Admission: RE | Disposition: A | Discharge status: Home / Self Care | Payer: Self-pay | Source: Home / Self Care | Attending: Obstetrics & Gynecology

## 2018-01-26 ENCOUNTER — Other Ambulatory Visit: Payer: Self-pay

## 2018-01-26 DIAGNOSIS — Z87440 Personal history of urinary (tract) infections: Secondary | ICD-10-CM | POA: Diagnosis not present

## 2018-01-26 DIAGNOSIS — Z8249 Family history of ischemic heart disease and other diseases of the circulatory system: Secondary | ICD-10-CM | POA: Insufficient documentation

## 2018-01-26 DIAGNOSIS — Z91048 Other nonmedicinal substance allergy status: Secondary | ICD-10-CM | POA: Insufficient documentation

## 2018-01-26 DIAGNOSIS — D252 Subserosal leiomyoma of uterus: Secondary | ICD-10-CM | POA: Insufficient documentation

## 2018-01-26 DIAGNOSIS — Z841 Family history of disorders of kidney and ureter: Secondary | ICD-10-CM | POA: Diagnosis not present

## 2018-01-26 DIAGNOSIS — Z8052 Family history of malignant neoplasm of bladder: Secondary | ICD-10-CM | POA: Diagnosis not present

## 2018-01-26 DIAGNOSIS — N800 Endometriosis of uterus: Secondary | ICD-10-CM | POA: Insufficient documentation

## 2018-01-26 DIAGNOSIS — Z79899 Other long term (current) drug therapy: Secondary | ICD-10-CM | POA: Insufficient documentation

## 2018-01-26 DIAGNOSIS — Z881 Allergy status to other antibiotic agents status: Secondary | ICD-10-CM | POA: Diagnosis not present

## 2018-01-26 DIAGNOSIS — M1990 Unspecified osteoarthritis, unspecified site: Secondary | ICD-10-CM | POA: Insufficient documentation

## 2018-01-26 DIAGNOSIS — Z91040 Latex allergy status: Secondary | ICD-10-CM | POA: Diagnosis not present

## 2018-01-26 DIAGNOSIS — F419 Anxiety disorder, unspecified: Secondary | ICD-10-CM | POA: Insufficient documentation

## 2018-01-26 DIAGNOSIS — Z833 Family history of diabetes mellitus: Secondary | ICD-10-CM | POA: Diagnosis not present

## 2018-01-26 DIAGNOSIS — E669 Obesity, unspecified: Secondary | ICD-10-CM | POA: Insufficient documentation

## 2018-01-26 DIAGNOSIS — D251 Intramural leiomyoma of uterus: Secondary | ICD-10-CM | POA: Diagnosis not present

## 2018-01-26 DIAGNOSIS — N920 Excessive and frequent menstruation with regular cycle: Secondary | ICD-10-CM | POA: Diagnosis present

## 2018-01-26 HISTORY — PX: ABDOMINAL HYSTERECTOMY: SHX81

## 2018-01-26 HISTORY — PX: LAPAROSCOPIC BILATERAL SALPINGECTOMY: SHX5889

## 2018-01-26 HISTORY — PX: LAPAROSCOPIC HYSTERECTOMY: SHX1926

## 2018-01-26 LAB — POCT PREGNANCY, URINE: Preg Test, Ur: NEGATIVE

## 2018-01-26 LAB — ABO/RH: ABO/RH(D): A POS

## 2018-01-26 SURGERY — HYSTERECTOMY, TOTAL, LAPAROSCOPIC
Anesthesia: General

## 2018-01-26 MED ORDER — DEXAMETHASONE SODIUM PHOSPHATE 10 MG/ML IJ SOLN
4.0000 mg | INTRAMUSCULAR | Status: AC
  Administered 2018-01-26: 4 mg via INTRAVENOUS

## 2018-01-26 MED ORDER — FENTANYL CITRATE (PF) 250 MCG/5ML IJ SOLN
INTRAMUSCULAR | Status: AC
  Filled 2018-01-26: qty 5

## 2018-01-26 MED ORDER — OXYCODONE HCL 5 MG/5ML PO SOLN: 5.0000 mg | Freq: Once | ORAL | Status: DC | PRN

## 2018-01-26 MED ORDER — PROPOFOL 10 MG/ML IV BOLUS
INTRAVENOUS | Status: AC
  Filled 2018-01-26: qty 20

## 2018-01-26 MED ORDER — SCOPOLAMINE 1 MG/3DAYS TD PT72
1.0000 | MEDICATED_PATCH | TRANSDERMAL | Status: DC
  Administered 2018-01-26: 1.5 mg via TRANSDERMAL

## 2018-01-26 MED ORDER — ACETAMINOPHEN 500 MG PO TABS
ORAL_TABLET | ORAL | Status: AC
  Filled 2018-01-26: qty 2

## 2018-01-26 MED ORDER — ONDANSETRON HCL 4 MG/2ML IJ SOLN
INTRAMUSCULAR | Status: AC
  Filled 2018-01-26: qty 2

## 2018-01-26 MED ORDER — HEPARIN SODIUM (PORCINE) 5000 UNIT/ML IJ SOLN
INTRAMUSCULAR | Status: AC
  Administered 2018-01-26: 5000 [IU] via SUBCUTANEOUS
  Filled 2018-01-26: qty 1

## 2018-01-26 MED ORDER — LACTATED RINGERS IV SOLN
INTRAVENOUS | Status: DC
  Administered 2018-01-26 (×2): via INTRAVENOUS

## 2018-01-26 MED ORDER — CELECOXIB 200 MG PO CAPS
ORAL_CAPSULE | ORAL | Status: AC
  Filled 2018-01-26: qty 1

## 2018-01-26 MED ORDER — SUGAMMADEX SODIUM 200 MG/2ML IV SOLN
INTRAVENOUS | Status: AC
  Filled 2018-01-26: qty 2

## 2018-01-26 MED ORDER — PROPOFOL 10 MG/ML IV BOLUS
INTRAVENOUS | Status: DC | PRN
  Administered 2018-01-26: 200 mg via INTRAVENOUS

## 2018-01-26 MED ORDER — MIDAZOLAM HCL 2 MG/2ML IJ SOLN
INTRAMUSCULAR | Status: DC | PRN
  Administered 2018-01-26: 2 mg via INTRAVENOUS

## 2018-01-26 MED ORDER — FENTANYL CITRATE (PF) 100 MCG/2ML IJ SOLN
25.0000 ug | INTRAMUSCULAR | Status: DC | PRN
  Administered 2018-01-26 (×4): 25 ug via INTRAVENOUS

## 2018-01-26 MED ORDER — GABAPENTIN 300 MG PO CAPS
ORAL_CAPSULE | ORAL | Status: AC
  Administered 2018-01-26: 600 mg via ORAL
  Filled 2018-01-26: qty 2

## 2018-01-26 MED ORDER — FENTANYL CITRATE (PF) 100 MCG/2ML IJ SOLN
INTRAMUSCULAR | Status: AC
  Filled 2018-01-26: qty 2

## 2018-01-26 MED ORDER — KETOROLAC TROMETHAMINE 30 MG/ML IJ SOLN
INTRAMUSCULAR | Status: DC | PRN
  Administered 2018-01-26: 30 mg via INTRAVENOUS

## 2018-01-26 MED ORDER — ONDANSETRON 8 MG PO TBDP: 8.0000 mg | ORAL_TABLET | Freq: Three times a day (TID) | ORAL | 0 refills | Status: DC | PRN

## 2018-01-26 MED ORDER — PROMETHAZINE HCL 25 MG/ML IJ SOLN
6.2500 mg | INTRAMUSCULAR | Status: DC | PRN
  Administered 2018-01-26: 6.25 mg via INTRAVENOUS

## 2018-01-26 MED ORDER — DEXAMETHASONE SODIUM PHOSPHATE 10 MG/ML IJ SOLN
INTRAMUSCULAR | Status: DC | PRN
  Administered 2018-01-26: 5 mg via INTRAVENOUS

## 2018-01-26 MED ORDER — LACTATED RINGERS IV SOLN: INTRAVENOUS | Status: DC

## 2018-01-26 MED ORDER — DEXAMETHASONE SODIUM PHOSPHATE 10 MG/ML IJ SOLN
INTRAMUSCULAR | Status: AC
  Filled 2018-01-26: qty 1

## 2018-01-26 MED ORDER — ACETAMINOPHEN 500 MG PO TABS
1000.0000 mg | ORAL_TABLET | ORAL | Status: AC
  Administered 2018-01-26: 1000 mg via ORAL

## 2018-01-26 MED ORDER — OXYCODONE HCL 5 MG PO TABS: 5.0000 mg | ORAL_TABLET | Freq: Once | ORAL | Status: DC | PRN

## 2018-01-26 MED ORDER — KETOROLAC TROMETHAMINE 30 MG/ML IJ SOLN
INTRAMUSCULAR | Status: AC
  Filled 2018-01-26: qty 1

## 2018-01-26 MED ORDER — LIDOCAINE HCL (PF) 2 % IJ SOLN
INTRAMUSCULAR | Status: AC
  Filled 2018-01-26: qty 10

## 2018-01-26 MED ORDER — CELECOXIB 200 MG PO CAPS
400.0000 mg | ORAL_CAPSULE | ORAL | Status: AC
  Administered 2018-01-26: 400 mg via ORAL

## 2018-01-26 MED ORDER — IBUPROFEN 800 MG PO TABS: 800.0000 mg | ORAL_TABLET | Freq: Four times a day (QID) | ORAL | 0 refills | Status: DC

## 2018-01-26 MED ORDER — CEFAZOLIN SODIUM-DEXTROSE 2-4 GM/100ML-% IV SOLN
2.0000 g | Freq: Once | INTRAVENOUS | Status: AC
  Administered 2018-01-26: 2 g via INTRAVENOUS

## 2018-01-26 MED ORDER — FENTANYL CITRATE (PF) 100 MCG/2ML IJ SOLN
INTRAMUSCULAR | Status: AC
  Administered 2018-01-26: 25 ug via INTRAVENOUS
  Filled 2018-01-26: qty 2

## 2018-01-26 MED ORDER — CELECOXIB 200 MG PO CAPS
ORAL_CAPSULE | ORAL | Status: AC
  Administered 2018-01-26: 400 mg via ORAL
  Filled 2018-01-26: qty 2

## 2018-01-26 MED ORDER — MEPERIDINE HCL 50 MG/ML IJ SOLN: 6.2500 mg | INTRAMUSCULAR | Status: DC | PRN

## 2018-01-26 MED ORDER — ROCURONIUM BROMIDE 100 MG/10ML IV SOLN
INTRAVENOUS | Status: DC | PRN
  Administered 2018-01-26: 20 mg via INTRAVENOUS
  Administered 2018-01-26: 5 mg via INTRAVENOUS
  Administered 2018-01-26: 45 mg via INTRAVENOUS

## 2018-01-26 MED ORDER — SCOPOLAMINE 1 MG/3DAYS TD PT72
MEDICATED_PATCH | TRANSDERMAL | Status: AC
  Administered 2018-01-26: 1.5 mg via TRANSDERMAL
  Filled 2018-01-26: qty 1

## 2018-01-26 MED ORDER — PROPOFOL 500 MG/50ML IV EMUL
INTRAVENOUS | Status: DC | PRN
  Administered 2018-01-26: 20 ug/kg/min via INTRAVENOUS

## 2018-01-26 MED ORDER — HEPARIN SODIUM (PORCINE) 5000 UNIT/ML IJ SOLN
5000.0000 [IU] | Freq: Once | INTRAMUSCULAR | Status: AC
  Administered 2018-01-26: 5000 [IU] via SUBCUTANEOUS

## 2018-01-26 MED ORDER — ROCURONIUM BROMIDE 50 MG/5ML IV SOLN
INTRAVENOUS | Status: AC
  Filled 2018-01-26: qty 1

## 2018-01-26 MED ORDER — GABAPENTIN 300 MG PO CAPS
600.0000 mg | ORAL_CAPSULE | ORAL | Status: AC
  Administered 2018-01-26: 600 mg via ORAL

## 2018-01-26 MED ORDER — SUCCINYLCHOLINE CHLORIDE 20 MG/ML IJ SOLN
INTRAMUSCULAR | Status: AC
  Filled 2018-01-26: qty 1

## 2018-01-26 MED ORDER — CEFAZOLIN SODIUM-DEXTROSE 2-4 GM/100ML-% IV SOLN
INTRAVENOUS | Status: AC
  Filled 2018-01-26: qty 100

## 2018-01-26 MED ORDER — OXYCODONE HCL 5 MG PO TABS: 5.0000 mg | ORAL_TABLET | ORAL | 0 refills | Status: DC | PRN

## 2018-01-26 MED ORDER — MIDAZOLAM HCL 2 MG/2ML IJ SOLN
INTRAMUSCULAR | Status: AC
  Filled 2018-01-26: qty 2

## 2018-01-26 MED ORDER — PROMETHAZINE HCL 25 MG/ML IJ SOLN
INTRAMUSCULAR | Status: AC
  Administered 2018-01-26: 6.25 mg via INTRAVENOUS
  Filled 2018-01-26: qty 1

## 2018-01-26 MED ORDER — SUGAMMADEX SODIUM 200 MG/2ML IV SOLN
INTRAVENOUS | Status: DC | PRN
  Administered 2018-01-26: 200 mg via INTRAVENOUS

## 2018-01-26 MED ORDER — ONDANSETRON HCL 4 MG/2ML IJ SOLN
INTRAMUSCULAR | Status: DC | PRN
  Administered 2018-01-26: 4 mg via INTRAVENOUS

## 2018-01-26 MED ORDER — FENTANYL CITRATE (PF) 100 MCG/2ML IJ SOLN
INTRAMUSCULAR | Status: DC | PRN
  Administered 2018-01-26 (×3): 50 ug via INTRAVENOUS

## 2018-01-26 MED ORDER — MORPHINE SULFATE (PF) 4 MG/ML IV SOLN: 1.0000 mg | INTRAVENOUS | Status: DC | PRN

## 2018-01-26 MED ORDER — SUCCINYLCHOLINE CHLORIDE 20 MG/ML IJ SOLN
INTRAMUSCULAR | Status: DC | PRN
  Administered 2018-01-26: 100 mg via INTRAVENOUS

## 2018-01-26 MED ORDER — DEXAMETHASONE SODIUM PHOSPHATE 10 MG/ML IJ SOLN
INTRAMUSCULAR | Status: AC
  Administered 2018-01-26: 4 mg via INTRAVENOUS
  Filled 2018-01-26: qty 1

## 2018-01-26 MED ORDER — LIDOCAINE HCL (CARDIAC) PF 100 MG/5ML IV SOSY
PREFILLED_SYRINGE | INTRAVENOUS | Status: DC | PRN
  Administered 2018-01-26: 100 mg via INTRAVENOUS

## 2018-01-26 MED ORDER — SCOPOLAMINE 1 MG/3DAYS TD PT72: 1.0000 | MEDICATED_PATCH | TRANSDERMAL | 0 refills | Status: DC

## 2018-01-26 SURGICAL SUPPLY — 58 items
BACTOSHIELD CHG 4% 4OZ (MISCELLANEOUS) ×1
BAG URINE DRAINAGE (UROLOGICAL SUPPLIES) ×4 IMPLANT
BLADE SURG SZ11 CARB STEEL (BLADE) ×4 IMPLANT
CANISTER SUCT 1200ML W/VALVE (MISCELLANEOUS) ×4 IMPLANT
CATH FOLEY 2WAY  5CC 16FR (CATHETERS) ×2
CATH URTH 16FR FL 2W BLN LF (CATHETERS) ×2 IMPLANT
CHLORAPREP W/TINT 26ML (MISCELLANEOUS) ×8 IMPLANT
COVER WAND RF STERILE (DRAPES) ×4 IMPLANT
DEFOGGER SCOPE WARMER CLEARIFY (MISCELLANEOUS) ×4 IMPLANT
DERMABOND ADVANCED (GAUZE/BANDAGES/DRESSINGS) ×2
DERMABOND ADVANCED .7 DNX12 (GAUZE/BANDAGES/DRESSINGS) ×2 IMPLANT
DEVICE SUTURE ENDOST 10MM (ENDOMECHANICALS) IMPLANT
DRAPE LEGGINS SURG 28X43 STRL (DRAPES) ×4 IMPLANT
DRAPE SHEET LG 3/4 BI-LAMINATE (DRAPES) ×4 IMPLANT
DRAPE UNDER BUTTOCK W/FLU (DRAPES) ×4 IMPLANT
ELECT REM PT RETURN 9FT ADLT (ELECTROSURGICAL) ×4
ELECTRODE REM PT RTRN 9FT ADLT (ELECTROSURGICAL) ×2 IMPLANT
GAUZE SPONGE 4X4 16PLY XRAY LF (GAUZE/BANDAGES/DRESSINGS) ×4 IMPLANT
GLOVE PI ORTHOPRO 6.5 (GLOVE) ×2
GLOVE PI ORTHOPRO STRL 6.5 (GLOVE) ×2 IMPLANT
GLOVE SURG SYN 6.5 ES PF (GLOVE) ×12 IMPLANT
GOWN STRL REUS W/ TWL LRG LVL3 (GOWN DISPOSABLE) ×6 IMPLANT
GOWN STRL REUS W/ TWL XL LVL3 (GOWN DISPOSABLE) ×2 IMPLANT
GOWN STRL REUS W/TWL LRG LVL3 (GOWN DISPOSABLE) ×6
GOWN STRL REUS W/TWL XL LVL3 (GOWN DISPOSABLE) ×2
HANDLE YANKAUER SUCT BULB TIP (MISCELLANEOUS) ×4 IMPLANT
IRRIGATION STRYKERFLOW (MISCELLANEOUS) IMPLANT
IRRIGATOR STRYKERFLOW (MISCELLANEOUS)
IV LACTATED RINGERS 1000ML (IV SOLUTION) ×4 IMPLANT
KIT PINK PAD W/HEAD ARE REST (MISCELLANEOUS) ×4
KIT PINK PAD W/HEAD ARM REST (MISCELLANEOUS) ×2 IMPLANT
KIT TURNOVER CYSTO (KITS) ×4 IMPLANT
L-HOOK LAP DISP 36CM (ELECTROSURGICAL)
LABEL OR SOLS (LABEL) IMPLANT
LHOOK LAP DISP 36CM (ELECTROSURGICAL) IMPLANT
LIGASURE VESSEL 5MM BLUNT TIP (ELECTROSURGICAL) ×4 IMPLANT
MANIPULATOR VCARE LG CRV RETR (MISCELLANEOUS) ×4 IMPLANT
MANIPULATOR VCARE SML CRV RETR (MISCELLANEOUS) IMPLANT
MANIPULATOR VCARE STD CRV RETR (MISCELLANEOUS) IMPLANT
NS IRRIG 500ML POUR BTL (IV SOLUTION) ×4 IMPLANT
PACK LAP CHOLECYSTECTOMY (MISCELLANEOUS) ×4 IMPLANT
PAD OB MATERNITY 4.3X12.25 (PERSONAL CARE ITEMS) ×4 IMPLANT
PAD PREP 24X41 OB/GYN DISP (PERSONAL CARE ITEMS) ×4 IMPLANT
PENCIL ELECTRO HAND CTR (MISCELLANEOUS) ×4 IMPLANT
SCRUB CHG 4% DYNA-HEX 4OZ (MISCELLANEOUS) ×3 IMPLANT
SET CYSTO W/LG BORE CLAMP LF (SET/KITS/TRAYS/PACK) IMPLANT
SET YANKAUER POOLE SUCT (MISCELLANEOUS) ×4 IMPLANT
SLEEVE ENDOPATH XCEL 5M (ENDOMECHANICALS) ×8 IMPLANT
SURGILUBE 2OZ TUBE FLIPTOP (MISCELLANEOUS) ×4 IMPLANT
SUT ENDO VLOC 180-0-8IN (SUTURE) IMPLANT
SUT MNCRL 4-0 (SUTURE) ×2
SUT MNCRL 4-0 27XMFL (SUTURE) ×2
SUT MNCRL AB 4-0 PS2 18 (SUTURE) ×4 IMPLANT
SUT VIC AB 0 CT1 36 (SUTURE) ×12 IMPLANT
SUTURE MNCRL 4-0 27XMF (SUTURE) ×2 IMPLANT
TROCAR XCEL NON-BLD 5MMX100MML (ENDOMECHANICALS) ×4 IMPLANT
TUBING INSUF HEATED (TUBING) ×4 IMPLANT
TUBING INSUFFLATION (TUBING) ×4 IMPLANT

## 2018-01-26 NOTE — OR Nursing (Signed)
Discussed discharge instructions with pt and husband. Both voice understanding. 

## 2018-01-26 NOTE — H&P (Signed)
Preoperative History and Physical  Abigail Montes is a 38 y.o. G3P3 here for definitive surgical management of menorrhagia.   No significant preoperative concerns.  Proposed surgery: total laparoscopic hysterectomy, bilateral salpingectomy  Past Medical History:  Diagnosis Date  . Anxiety   . Arthritis   . Complication of anesthesia   . PONV (postoperative nausea and vomiting)   . Recurrent UTI    Past Surgical History:  Procedure Laterality Date  . BREAST CYST ASPIRATION Left 2016  . FOOT SURGERY Left 2017  . FOOT TENDON SURGERY Right 08/2013   torn tendon  . TUBAL LIGATION  08/2003   OB History  Gravida Para Term Preterm AB Living  '3 3       3  '$ SAB TAB Ectopic Multiple Live Births               # Outcome Date GA Lbr Len/2nd Weight Sex Delivery Anes PTL Lv  3 Para           2 Para           1 Para             Obstetric Comments  1st Menstrual Cycle: 10  1st Pregnancy: 15  Patient denies any other pertinent gynecologic issues.   No current facility-administered medications on file prior to encounter.    Current Outpatient Medications on File Prior to Encounter  Medication Sig Dispense Refill  . Cyanocobalamin (B-12) 1000 MCG/ML KIT Inject 1 mL as directed every 30 (thirty) days.    Marland Kitchen gabapentin (NEURONTIN) 600 MG tablet Take 1 tablet (600 mg total) by mouth at bedtime. 90 tablet 2  . ibuprofen (ADVIL,MOTRIN) 200 MG tablet Take 400 mg by mouth every 8 (eight) hours as needed for pain.     Marland Kitchen omeprazole (PRILOSEC OTC) 20 MG tablet Take 20 mg by mouth daily.     Allergies  Allergen Reactions  . Ciprofloxacin     TENDON PAIN  . Tape Itching  . Latex Rash  . Macrobid [Nitrofurantoin] Swelling    Social History:   reports that she has never smoked. She has never used smokeless tobacco. She reports current alcohol use. She reports that she does not use drugs.  Family History  Problem Relation Age of Onset  . Diabetes Father   . Heart attack Father   . Kidney  disease Father   . Cancer Maternal Aunt   . Bladder Cancer Neg Hx     Review of Systems: Noncontributory  PHYSICAL EXAM: Blood pressure 128/86, pulse 63, temperature (!) 97.4 F (36.3 C), temperature source Tympanic, resp. rate 18, last menstrual period 01/22/2018, SpO2 98 %. General appearance - alert, well appearing, and in no distress Chest - clear to auscultation, no wheezes, rales or rhonchi, symmetric air entry Heart - normal rate and regular rhythm Abdomen - soft, nontender, nondistended, no masses or organomegaly Pelvic - examination not indicated Extremities - peripheral pulses normal, no pedal edema, no clubbing or cyanosis  Labs: Results for orders placed or performed during the hospital encounter of 01/26/18 (from the past 336 hour(s))  Pregnancy, urine POC   Collection Time: 01/26/18  9:31 AM  Result Value Ref Range   Preg Test, Ur NEGATIVE NEGATIVE  ABO/Rh   Collection Time: 01/26/18 10:06 AM  Result Value Ref Range   ABO/RH(D)      A POS Performed at [facility], [address] [place], [place] [address]   Results for orders placed or performed  during the hospital encounter of 01/19/18 (from the past 336 hour(s))  CBC   Collection Time: 01/19/18  8:57 AM  Result Value Ref Range   WBC 8.6 4.0 - 10.5 K/uL   RBC 4.36 3.87 - 5.11 MIL/uL   Hemoglobin 10.6 (L) 12.0 - 15.0 g/dL   HCT 35.0 (L) 36.0 - 46.0 %   MCV 80.3 80.0 - 100.0 fL   MCH 24.3 (L) 26.0 - 34.0 pg   MCHC 30.3 30.0 - 36.0 g/dL   RDW 17.5 (H) 11.5 - 15.5 %   Platelets 442 (H) 150 - 400 K/uL   nRBC 0.0 0.0 - 0.2 %  Type and screen [place]   Collection Time: 01/19/18  8:57 AM  Result Value Ref Range   ABO/RH(D) A POS    Antibody Screen NEG    Sample Expiration 02/02/2018    Extend sample reason      NO TRANSFUSIONS OR PREGNANCY IN THE PAST 3 MONTHS Performed at [facility], [address], [place], [place] [address]     Imaging Studies: No  results found.  Assessment: Patient Active Problem List   Diagnosis Date Noted  . Breast nodule 10/01/2014  . Breast mass, left 08/21/2014  . Costochondritis 07/01/2014    Plan: Patient will undergo surgical management with [facility] BS.   The risks of surgery were discussed in detail with the patient including but not limited to: bleeding which may require transfusion or reoperation; infection which may require antibiotics; injury to surrounding organs which may involve bowel, bladder, ureters ; need for additional procedures including laparoscopy or laparotomy; thromboembolic phenomenon, surgical site problems and other postoperative/anesthesia complications. Likelihood of success in alleviating the patient's condition was discussed. Routine postoperative instructions will be reviewed with the patient and her family in detail after surgery.  The patient concurred with the proposed plan, giving informed written consent for the surgery.  Patient has been NPO since last night she will remain NPO for procedure.  Anesthesia and OR aware.  Preoperative prophylactic antibiotics and SCDs ordered on call to the OR.  To OR when ready.  ----- Larey Days, MD Attending Obstetrician and Gynecologist [facility], Department of [facility]

## 2018-01-26 NOTE — Op Note (Signed)
Total Laparoscopic Hysterectomy Operative Note Procedure Date: 01/26/2018  Patient:  Abigail Montes  38 y.o. female  PRE-OPERATIVE DIAGNOSIS:  abnormal uterine bleeding  POST-OPERATIVE DIAGNOSIS:  abnormal uterine bleeding  PROCEDURE:  Procedure(s): HYSTERECTOMY TOTAL LAPAROSCOPIC (N/A) LAPAROSCOPIC BILATERAL SALPINGECTOMY (Bilateral)  SURGEON:  Surgeon(s) and Role:    * Ward, Honor Loh, MD - Primary    * Schermerhorn, Gwen Her, MD - Assisting  ANESTHESIA:  General via ET  I/O  Total I/O In: 1100 [I.V.:1000; IV Piggyback:100] Out: 550 [Urine:500; Blood:50]  FINDINGS:  Top-size uterus, normal ovaries and filshie-clipped fallopian tubes bilaterally.  Right ovary had two small simple cysts on each apex.   Normal upper abdomen, scarring of edge of left liver to anterior abdominal wall.  SPECIMEN: Uterus, Cervix, and bilateral fallopian tubes  COMPLICATIONS: none apparent  DISPOSITION: vital signs stable to PACU  Indication for Surgery: 38 y.o. G3P3 with previous tubal ligation and heavy menstrual bleeding presented and requested definitive surgery to resolve her bleeding.  Risks of surgery were discussed with the patient including but not limited to: bleeding which may require transfusion or reoperation; infection which may require antibiotics; injury to bowel, bladder, ureters or other surrounding organs; need for additional procedures including laparotomy, blood clot, incisional problems and other postoperative/anesthesia complications. Written informed consent was obtained.      PROCEDURE IN DETAIL:  The patient had 5000u Heparin Sub-q and sequential compression devices applied to her lower extremities while in the preoperative area.  She was then taken to the operating room. IV antibiotics were given. General anesthesia was administered via endotracheal route.  She was placed in the dorsal lithotomy position, and was prepped and draped in a sterile manner. A surgical time-out  was performed.  A Foley catheter was inserted into her bladder and attached to constant drainage and a V-Care uterine manipulator was then advanced into the uterus and a good fit around the cervix was noted. The gloves were changed, and attention was turned to the abdomen where an umbilical incision was made with the scalpel.  A 66mm trochar was inserted in the umbilical incision using a visiport method.Opening pressure was 23mmHg, and the abdomen was insufflated to 33mmHg carbon dioxide gas and adequate pneumoperitoneum was obtained. A survey of the patient's pelvis and abdomen revealed the findings as mentioned above. Two 72mm ports were inserted in the lower left and right quadrants under visualization.    The bilateral fallopian tubes were separated from the mesosalpinx using the Ligasure. The bilateral round ligaments were transected and anterior broad ligament divided and brought across the uterus to separate the vesicouterine peritoneum and create a bladder flap. The bladder was pushed away from the uterus. The bilateral uterine arteries were skeletonized, ligated and transected. The bilateral uterosacral and cardinal ligaments were ligated and transected. A colpotomy was made around the V-Care cervical cup and the uterus, cervix, and bilateral tubes were removed through the vagina. The vaginal cuff was closed vaginally using 0-Vicryl in a running locking stitch. This was tested for integrity using the surgeon's finger. After a change of gloves, the pneumoperitoneum was recreated and surgical site inspected, and found to be hemostatic. Bilateral ureters were visualized vermiuclating. No intraoperative injury to surrounding organs was noted. The abdomen was desufflated and all instruments were then removed.   All skin incisions were closed with 4-0 monocryl and covered with surgical glue. The patient tolerated the procedures well.  All instruments, needles, and sponge counts were correct x 2. The  patient was  taken to the recovery room in stable condition.   ---- Larey Days, MD Attending Obstetrician and [facility]

## 2018-01-26 NOTE — Anesthesia Post-op Follow-up Note (Signed)
Anesthesia QCDR form completed.        

## 2018-01-26 NOTE — Anesthesia Preprocedure Evaluation (Signed)
Anesthesia Evaluation  Patient identified by MRN, date of birth, ID band Patient awake    Reviewed: Allergy & Precautions, NPO status , Patient's Chart, lab work & pertinent test results  History of Anesthesia Complications (+) PONV and history of anesthetic complications  Airway Mallampati: II  TM Distance: <3 FB Neck ROM: Full    Dental no notable dental hx.    Pulmonary neg sleep apnea, neg COPD,    breath sounds clear to auscultation- rhonchi (-) wheezing      Cardiovascular Exercise Tolerance: Good (-) hypertension(-) CAD, (-) Past MI, (-) Cardiac Stents and (-) CABG  Rhythm:Regular Rate:Normal - Systolic murmurs and - Diastolic murmurs    Neuro/Psych neg Seizures Anxiety negative neurological ROS     GI/Hepatic negative GI ROS, Neg liver ROS,   Endo/Other  negative endocrine ROSneg diabetes  Renal/GU negative Renal ROS     Musculoskeletal  (+) Arthritis ,   Abdominal (+) + obese,   Peds  Hematology negative hematology ROS (+)   Anesthesia Other Findings Past Medical History: No date: Anxiety No date: Arthritis No date: Complication of anesthesia No date: PONV (postoperative nausea and vomiting) No date: Recurrent UTI   Reproductive/Obstetrics                             Anesthesia Physical Anesthesia Plan  ASA: II  Anesthesia Plan: General   Post-op Pain Management:    Induction: Intravenous  PONV Risk Score and Plan: 3 and Dexamethasone, Ondansetron, Propofol infusion and Scopolamine patch - Pre-op  Airway Management Planned: Oral ETT  Additional Equipment:   Intra-op Plan:   Post-operative Plan: Extubation in OR  Informed Consent: I have reviewed the patients History and Physical, chart, labs and discussed the procedure including the risks, benefits and alternatives for the proposed anesthesia with the patient or authorized representative who has indicated  his/her understanding and acceptance.   Dental advisory given  Plan Discussed with: CRNA and Anesthesiologist  Anesthesia Plan Comments:         Anesthesia Quick Evaluation

## 2018-01-26 NOTE — Anesthesia Postprocedure Evaluation (Signed)
Anesthesia Post Note  Patient: Abigail Montes  Procedure(s) Performed: HYSTERECTOMY TOTAL LAPAROSCOPIC (N/A ) LAPAROSCOPIC BILATERAL SALPINGECTOMY (Bilateral )  Patient location during evaluation: PACU Anesthesia Type: General Level of consciousness: awake and alert and oriented Pain management: pain level controlled Vital Signs Assessment: post-procedure vital signs reviewed and stable Respiratory status: spontaneous breathing, nonlabored ventilation and respiratory function stable Cardiovascular status: blood pressure returned to baseline and stable Postop Assessment: no signs of nausea or vomiting Anesthetic complications: no     Last Vitals:  Vitals:   01/26/18 1458 01/26/18 1503  BP: 113/69   Pulse: 61 62  Resp: 20 17  Temp:    SpO2: 97% 98%    Last Pain:  Vitals:   01/26/18 1503  TempSrc:   PainSc: 5                  Gabrielle Wakeland

## 2018-01-26 NOTE — Transfer of Care (Signed)
Immediate Anesthesia Transfer of Care Note  Patient: Abigail Montes  Procedure(s) Performed: HYSTERECTOMY TOTAL LAPAROSCOPIC (N/A ) LAPAROSCOPIC BILATERAL SALPINGECTOMY (Bilateral )  Patient Location: PACU  Anesthesia Type:General  Level of Consciousness: drowsy  Airway & Oxygen Therapy: Patient Spontanous Breathing and Patient connected to nasal cannula oxygen  Post-op Assessment: Report given to RN and Post -op Vital signs reviewed and stable  Post vital signs: stable  Last Vitals:  Vitals Value Taken Time  BP 130/74 01/26/2018  2:17 PM  Temp 36.4 C 01/26/2018  2:17 PM  Pulse 65 01/26/2018  2:27 PM  Resp 17 01/26/2018  2:27 PM  SpO2 100 % 01/26/2018  2:27 PM  Vitals shown include unvalidated device data.  Last Pain:  Vitals:   01/26/18 0935  TempSrc: Tympanic  PainSc: 3          Complications: No apparent anesthesia complications

## 2018-01-26 NOTE — Anesthesia Procedure Notes (Signed)
Procedure Name: Intubation Date/Time: 01/26/2018 12:23 PM Performed by: Lavone Orn, CRNA Pre-anesthesia Checklist: Patient identified, Emergency Drugs available, Suction available, Patient being monitored and Timeout performed Patient Re-evaluated:Patient Re-evaluated prior to induction Oxygen Delivery Method: Circle system utilized Preoxygenation: Pre-oxygenation with 100% oxygen Induction Type: IV induction Ventilation: Mask ventilation without difficulty Laryngoscope Size: Mac and 4 Grade View: Grade II Tube type: Oral Tube size: 7.0 mm Number of attempts: 1 Airway Equipment and Method: Stylet (cricoid pressure) Placement Confirmation: ETT inserted through vocal cords under direct vision,  positive ETCO2 and breath sounds checked- equal and bilateral Secured at: 22 cm Tube secured with: Tape Dental Injury: Teeth and Oropharynx as per pre-operative assessment  Difficulty Due To: Difficulty was anticipated and Difficult Airway- due to anterior larynx

## 2018-01-26 NOTE — OR Nursing (Signed)
Verified allergies and order for celecoxib with both Myra in pharmacy and Dr Leonides Schanz, both stated okay to give.  Discussed with patient that both macrobid and celecoxib both contain sulfa and the potential for reaction, patient desires to take med.

## 2018-01-26 NOTE — Discharge Instructions (Addendum)
Discharge instructions:  Call office if you have any of the following: fever >101 F, chills, shortness of breath, excessive vaginal bleeding, incision drainage or problems, leg pain or redness, or any other concerns.   Activity: Do not lift > 10 lbs for 8 weeks.  No intercourse or tampons for 8 weeks.  No driving for 1-2 weeks, or until you are certain you can slam on the brakes.   You may feel some pain in your upper right abdomen/rib and right shoulder.  This is from the gas in the abdomen for surgery. This will subside over time, please be patient!  Phazyme has been suggested to help if it is intolerable.  Take 800mg  Ibuprofen and 1000mg  Tylenol around the clock, every 6 hours for at least the first 3-5 days.  After this you can take as needed.  This will help decrease inflammation and promote healing.  The narcotics you'll take just as needed, as they just trick your brain into thinking its not in pain.    Please don't limit yourself in terms of routine activity.  You will be able to do most things, although they may take longer to do or be a little painful.  You can do it!  Don't be a hero, but don't be a wimp either!    AMBULATORY SURGERY  DISCHARGE INSTRUCTIONS   1) The drugs that you were given will stay in your system until tomorrow so for the next 24 hours you should not:  A) Drive an automobile B) Make any legal decisions C) Drink any alcoholic beverage   2) You may resume regular meals tomorrow.  Today it is better to start with liquids and gradually work up to solid foods.  You may eat anything you prefer, but it is better to start with liquids, then soup and crackers, and gradually work up to solid foods.   3) Please notify your doctor immediately if you have any unusual bleeding, trouble breathing, redness and pain at the surgery site, drainage, fever, or pain not relieved by medication.    4) Additional Instructions:        Please contact your physician  with any problems or Same Day Surgery at [phone], Monday through Friday 6 am to 4 pm, or [place] [facility] [place] number at [phone].

## 2018-01-27 ENCOUNTER — Encounter: Payer: Self-pay | Admitting: Obstetrics & Gynecology

## 2018-01-31 LAB — SURGICAL PATHOLOGY

## 2018-02-01 ENCOUNTER — Other Ambulatory Visit: Payer: Self-pay

## 2018-02-01 ENCOUNTER — Emergency Department (HOSPITAL_COMMUNITY)
Admission: EM | Admit: 2018-02-01 | Discharge: 2018-02-02 | Disposition: A | Discharge status: Home / Self Care | Payer: Managed Care, Other (non HMO) | Attending: Emergency Medicine | Admitting: Emergency Medicine

## 2018-02-01 ENCOUNTER — Encounter (HOSPITAL_COMMUNITY): Payer: Self-pay

## 2018-02-01 DIAGNOSIS — R102 Pelvic and perineal pain: Secondary | ICD-10-CM | POA: Diagnosis not present

## 2018-02-01 DIAGNOSIS — Z91040 Latex allergy status: Secondary | ICD-10-CM | POA: Diagnosis not present

## 2018-02-01 DIAGNOSIS — Z79899 Other long term (current) drug therapy: Secondary | ICD-10-CM | POA: Insufficient documentation

## 2018-02-01 DIAGNOSIS — R55 Syncope and collapse: Secondary | ICD-10-CM | POA: Insufficient documentation

## 2018-02-01 LAB — CBC
HCT: 36 % (ref 36.0–46.0)
Hemoglobin: 10.6 g/dL — ABNORMAL LOW (ref 12.0–15.0)
MCH: 24.2 pg — ABNORMAL LOW (ref 26.0–34.0)
MCHC: 29.4 g/dL — ABNORMAL LOW (ref 30.0–36.0)
MCV: 82.2 fL (ref 80.0–100.0)
Platelets: 368 10*3/uL (ref 150–400)
RBC: 4.38 MIL/uL (ref 3.87–5.11)
RDW: 17.3 % — ABNORMAL HIGH (ref 11.5–15.5)
WBC: 13.6 10*3/uL — ABNORMAL HIGH (ref 4.0–10.5)
nRBC: 0 % (ref 0.0–0.2)

## 2018-02-01 LAB — BASIC METABOLIC PANEL
Anion gap: 8 (ref 5–15)
BUN: 10 mg/dL (ref 6–20)
CO2: 26 mmol/L (ref 22–32)
Calcium: 9.2 mg/dL (ref 8.9–10.3)
Chloride: 104 mmol/L (ref 98–111)
Creatinine, Ser: 0.8 mg/dL (ref 0.44–1.00)
GFR calc Af Amer: >60 mL/min (ref >60)
GFR calc non Af Amer: >60 mL/min (ref >60)
Glucose, Bld: 130 mg/dL — ABNORMAL HIGH (ref 70–99)
Potassium: 3.9 mmol/L (ref 3.5–5.1)
Sodium: 138 mmol/L (ref 135–145)

## 2018-02-01 LAB — URINALYSIS, ROUTINE W REFLEX MICROSCOPIC
Bilirubin Urine: NEGATIVE
Glucose, UA: >=500 mg/dL — AB
Hgb urine dipstick: NEGATIVE
Ketones, ur: NEGATIVE mg/dL
Leukocytes, UA: NEGATIVE
Nitrite: NEGATIVE
Protein, ur: NEGATIVE mg/dL
Specific Gravity, Urine: 1.01 (ref 1.005–1.030)
pH: 7 (ref 5.0–8.0)

## 2018-02-01 LAB — TYPE AND SCREEN
ABO/RH(D): A POS
Antibody Screen: NEGATIVE

## 2018-02-01 LAB — I-STAT BETA HCG BLOOD, ED (MC, WL, AP ONLY): I-stat hCG, quantitative: <5 m[IU]/mL (ref <5)

## 2018-02-01 LAB — I-STAT CG4 LACTIC ACID, ED: Lactic Acid, Venous: 1.07 mmol/L (ref 0.5–1.9)

## 2018-02-01 LAB — ABO/RH: ABO/RH(D): A POS

## 2018-02-01 MED ORDER — SODIUM CHLORIDE 0.9 % IV BOLUS
1000.0000 mL | Freq: Once | INTRAVENOUS | Status: AC
  Administered 2018-02-01: 1000 mL via INTRAVENOUS

## 2018-02-01 NOTE — ED Triage Notes (Signed)
Pt reports hysterectomy last week and has now had multiple syncopal episodes today. Pt reports moderate vaginal bleeding at first but now she is just spotting. Pt ill appearing in triage

## 2018-02-01 NOTE — ED Provider Notes (Signed)
[facility] EMERGENCY DEPARTMENT Provider Note   CSN: [id number] Arrival date & time: 02/01/18  2117     History   Chief Complaint Chief Complaint  Patient presents with  . Loss of Consciousness    HPI Abigail Montes is a 38 y.o. female who presents emergency department chief complaint of syncope.  Patient is status post partial hysterectomy on 01/26/2018.  Patient states that she was doing very well at home on Saturday and Sunday.  She says on Sunday she was able to go out to eat and go shopping with her kids however since Monday she has had progressively worsening pelvic pain all the way up to her umbilicus and states that she is fatigued with body aches, foul odor and discharge from her vagina.  She states that she cannot stand up for more than 15 to 20 minutes at a time without getting extremely lightheaded and feeling as if she is going to pass out.  She denies heavy vaginal bleeding and says that she is only having a little bit of spotting.  Patient states that she has had temperatures at home up to 100.3.  She did discuss this with her surgeon however they did not feel that this warranted any evaluation or treatment.  She denies urinary symptoms, nausea or vomiting.  HPI . Past Medical History:  Diagnosis Date  . Anxiety   . Arthritis   . Complication of anesthesia   . PONV (postoperative nausea and vomiting)   . Recurrent UTI     Patient Active Problem List   Diagnosis Date Noted  . Breast nodule 10/01/2014  . Breast mass, left 08/21/2014  . Costochondritis 07/01/2014    Past Surgical History:  Procedure Laterality Date  . BREAST CYST ASPIRATION Left 2016  . FOOT SURGERY Left 2017  . FOOT TENDON SURGERY Right 08/2013   torn tendon  . LAPAROSCOPIC BILATERAL SALPINGECTOMY Bilateral 01/26/2018   Procedure: LAPAROSCOPIC BILATERAL SALPINGECTOMY;  Surgeon: Ward, Honor Loh, MD;  Location: [facility] ORS;  Service: Gynecology;  Laterality: Bilateral;  .  LAPAROSCOPIC HYSTERECTOMY N/A 01/26/2018   Procedure: HYSTERECTOMY TOTAL LAPAROSCOPIC;  Surgeon: Ward, Honor Loh, MD;  Location: [facility] ORS;  Service: Gynecology;  Laterality: N/A;  . TUBAL LIGATION  08/2003     OB History    Gravida  3   Para  3   Term      Preterm      AB      Living  3     SAB      TAB      Ectopic      Multiple      Live Births           Obstetric Comments  1st Menstrual Cycle: 10 1st Pregnancy: 15          Home Medications    Prior to Admission medications   Medication Sig Start Date End Date Taking? Authorizing Provider  Cyanocobalamin (B-12) 1000 MCG/ML KIT Inject 1 mL as directed every 30 (thirty) days.    [provider]  gabapentin (NEURONTIN) 600 MG tablet Take 1 tablet (600 mg total) by mouth at bedtime. 09/19/17   Hyatt, Max T, DPM  ibuprofen (ADVIL,MOTRIN) 800 MG tablet Take 1 tablet (800 mg total) by mouth every 6 (six) hours. 01/26/18   Ward, Honor Loh, MD  omeprazole (PRILOSEC OTC) 20 MG tablet Take 20 mg by mouth daily.    [provider]  ondansetron (ZOFRAN ODT) 8 MG  disintegrating tablet Take 1 tablet (8 mg total) by mouth every 8 (eight) hours as needed for nausea or vomiting. 01/26/18   Ward, Honor Loh, MD  oxyCODONE (ROXICODONE) 5 MG immediate release tablet Take 1 tablet (5 mg total) by mouth every 4 (four) hours as needed. 01/26/18 01/26/19  Ward, Honor Loh, MD  scopolamine (TRANSDERM-SCOP) 1 MG/3DAYS Place 1 patch (1.5 mg total) onto the skin every 3 (three) days. Remove on Monday morning 01/29/18   Ward, Honor Loh, MD    Family History Family History  Problem Relation Age of Onset  . Diabetes Father   . Heart attack Father   . Kidney disease Father   . Cancer Maternal Aunt   . Bladder Cancer Neg Hx     Social History Social History   Tobacco Use  . Smoking status: Never Smoker  . Smokeless tobacco: Never Used  Substance Use Topics  . Alcohol use: Yes    Comment: rare  . Drug use: No      Allergies   Ciprofloxacin; Tape; Latex; and Macrobid [nitrofurantoin]   Review of Systems Review of Systems Ten systems reviewed and are negative for acute change, except as noted in the HPI.    Physical Exam Updated Vital Signs BP (!) 131/97   Pulse 78   Temp 98.2 F (36.8 C) (Oral)   Resp 14   LMP 01/22/2018   SpO2 100%   Physical Exam Vitals signs and nursing note reviewed.  Constitutional:      General: She is not in acute distress.    Appearance: She is well-developed. She is not diaphoretic.  HENT:     Head: Normocephalic and atraumatic.  Eyes:     General: No scleral icterus.    Conjunctiva/sclera: Conjunctivae normal.  Neck:     Musculoskeletal: Normal range of motion.  Cardiovascular:     Rate and Rhythm: Normal rate and regular rhythm.     Heart sounds: Normal heart sounds. No murmur. No friction rub. No gallop.   Pulmonary:     Effort: Pulmonary effort is normal. No respiratory distress.     Breath sounds: Normal breath sounds.  Abdominal:     General: Bowel sounds are normal. There is distension.     Palpations: Abdomen is soft. There is no mass.     Tenderness: There is abdominal tenderness. There is no guarding.     Comments: Healing trocar sites in the umbilicus and bilateral lower pelvic regions.  Mild abdominal distention to the umbilicus.  Tender to palpation.  Genitourinary:    Comments: Pelvic exam: VULVA: normal appearing vulva with no masses, tenderness or lesions, VAGINA: normal appearing vagina with normal color and discharge, no lesions, CERVIX:  surgically absent, - macerated sutures seen at the post vagina, there is turbid fluid, no purulence exam limited by pain.  Skin:    General: Skin is warm and dry.  Neurological:     Mental Status: She is alert and oriented to person, place, and time.  Psychiatric:        Behavior: Behavior normal.      ED Treatments / Results  Labs (all labs ordered are listed, but only abnormal  results are displayed) Labs Reviewed  CBC - Abnormal; Notable for the following components:      Result Value   WBC 13.6 (*)    Hemoglobin 10.6 (*)    MCH 24.2 (*)    MCHC 29.4 (*)    RDW 17.3 (*)    All  other components within normal limits  WET PREP, GENITAL  BASIC METABOLIC PANEL  URINALYSIS, ROUTINE W REFLEX MICROSCOPIC  I-STAT BETA HCG BLOOD, ED ([facility], [facility], [facility] ONLY)  I-STAT CG4 LACTIC ACID, ED  TYPE AND SCREEN    EKG None  Radiology No results found.  Procedures Procedures (including critical care time)  Medications Ordered in ED Medications  sodium chloride 0.9 % bolus 1,000 mL (has no administration in time range)     Initial Impression / Assessment and Plan / ED Course  I have reviewed the triage vital signs and the nursing notes.  Pertinent labs & imaging results that were available during my care of the patient were reviewed by me and considered in my medical decision making (see chart for details).     38 year old female who is status post hysterectomy.  Her back examination is without significant abnormality, no signs of infection.  The surgical wound site appears to be well-healing.  CT scan shows some general inflammation at the vaginal cuff which is since expected postsurgically.  Patient has a mildly elevated white count but she is afebrile.  She has required no pain medications here in the emergency department.  I discussed the case with Dr. Ellender Hose and we reviewed CT scan.  Patient appears appropriate for discharge at this time.  She is advised to follow closely with her OB/GYN.  Final Clinical Impressions(s) / ED Diagnoses   Final diagnoses:  None    ED Discharge Orders    None       Margarita Mail, PA-C 02/02/18 1856    Duffy Bruce, MD 02/04/18 1350

## 2018-02-01 NOTE — ED Notes (Signed)
Pt. reports increased dizziness, lightheadedness when changing positions during orthostatic VS.

## 2018-02-02 ENCOUNTER — Emergency Department (HOSPITAL_COMMUNITY): Payer: Managed Care, Other (non HMO)

## 2018-02-02 LAB — WET PREP, GENITAL
Sperm: NONE SEEN
Trich, Wet Prep: NONE SEEN
Yeast Wet Prep HPF POC: NONE SEEN

## 2018-02-02 MED ORDER — IOHEXOL 300 MG/ML  SOLN
100.0000 mL | Freq: Once | INTRAMUSCULAR | Status: AC | PRN
  Administered 2018-02-02: 100 mL via INTRAVENOUS

## 2018-02-02 MED ORDER — IBUPROFEN 800 MG PO TABS
800.0000 mg | ORAL_TABLET | Freq: Once | ORAL | Status: AC
  Administered 2018-02-02: 800 mg via ORAL
  Filled 2018-02-02: qty 1

## 2018-02-02 NOTE — Discharge Instructions (Signed)
Get help right away if you: Have a severe headache. Faint once or repeatedly. Have pain in your chest, abdomen, or back. Have a very fast or irregular heartbeat (palpitations). Have pain when you breathe. Are bleeding from your mouth or rectum, or you have black or tarry stool. Have a seizure. Are confused. Have trouble walking. Have severe weakness. Have vision problems. 

## 2018-03-01 ENCOUNTER — Encounter: Payer: Self-pay | Admitting: Obstetrics & Gynecology

## 2018-03-01 ENCOUNTER — Ambulatory Visit: Payer: Managed Care, Other (non HMO) | Admitting: Obstetrics & Gynecology

## 2018-03-01 VITALS — BP 140/78 | HR 71 | Ht 65.0 in | Wt 207.0 lb

## 2018-03-01 DIAGNOSIS — N760 Acute vaginitis: Secondary | ICD-10-CM | POA: Diagnosis not present

## 2018-03-01 DIAGNOSIS — R5383 Other fatigue: Secondary | ICD-10-CM | POA: Diagnosis not present

## 2018-03-01 DIAGNOSIS — Z90710 Acquired absence of both cervix and uterus: Secondary | ICD-10-CM | POA: Diagnosis not present

## 2018-03-01 NOTE — Progress Notes (Signed)
Pt is a 38 yo with pelvic pain, dizziness, fatigue, vaginal bleeding, vaginal odor, hot flashes, sweats, mood irritability all over the past few weeks since having a hysterectomy elsewhere on Jan3.  She is not pleased with the results and the care she has received since the surgery and so wishes to transfer her care here.   She reports she had Relampago for menorrhagia and anemia, and was hoping to have no periods and no sx's of anemia after the surgery.  She works as a Surveyor, minerals at [employer].  She has gone back at half days this week, and feels very tired even at that level.  She has intermittent vag bleeding, even this week.  Odor is constantly there, new.  Pain is now more upper abdomen.  Incisions have healed well.  Normal diet.  She had CT on 02/02/18 for pain and feeling poorly after surgery.  There was post op changes (possible fluid collection related to that) and no abnormalities.   Labs have been done as well, no recent CBC since 1/10.  She had hormone testing that shows proper ovarian and thyroid function.  PMHx: She  has a past medical history of Anxiety, Arthritis, Complication of anesthesia, PONV (postoperative nausea and vomiting), and Recurrent UTI. Also,  has a past surgical history that includes Foot Tendon Surgery (Right, 08/2013); Tubal ligation (08/2003); Breast cyst aspiration (Left, 2016); Foot surgery (Left, 2017); Laparoscopic hysterectomy (N/A, 01/26/2018); Laparoscopic bilateral salpingectomy (Bilateral, 01/26/2018); and Abdominal hysterectomy (01/26/2018)., family history includes Cancer in her maternal aunt; Diabetes in her father; Heart attack in her father; Kidney disease in her father.,  reports that she has never smoked. She has never used smokeless tobacco. She reports current alcohol use. She reports that she does not use drugs.  She has a current medication list which includes the following prescription(s): gabapentin, ibuprofen, omeprazole, and b-12. Also, is allergic to  ciprofloxacin; tape; latex; and macrobid [nitrofurantoin].  Review of Systems  Constitutional: Positive for diaphoresis and malaise/fatigue. Negative for chills and fever.  HENT: Negative for congestion, sinus pain and sore throat.   Eyes: Negative for blurred vision and pain.  Respiratory: Negative for cough and wheezing.   Cardiovascular: Negative for chest pain and leg swelling.  Gastrointestinal: Negative for abdominal pain, constipation, diarrhea, heartburn, nausea and vomiting.  Genitourinary: Negative for dysuria, frequency, hematuria and urgency.  Musculoskeletal: Negative for back pain, joint pain, myalgias and neck pain.  Skin: Negative for itching and rash.  Neurological: Positive for dizziness and weakness. Negative for tremors.  Endo/Heme/Allergies: Does not bruise/bleed easily.  Psychiatric/Behavioral: Negative for depression. The patient is not nervous/anxious and does not have insomnia.     Objective: BP 140/78   Pulse 71   Ht 5\' 5"  (1.651 m)   Wt 207 lb (93.9 kg)   LMP 01/18/2018 (Approximate)   BMI 34.45 kg/m  Physical Exam Constitutional:      General: She is not in acute distress.    Appearance: She is well-developed.  Genitourinary:     Pelvic exam was performed with patient supine.     Vagina and rectum normal.     No vaginal erythema or bleeding.     No right or left adnexal mass present.     Right adnexa not tender.     Left adnexa not tender.     Genitourinary Comments: Cervix and uterus absent. Vaginal cuff healing well.  Cardiovascular:     Rate and Rhythm: Normal rate.  Pulmonary:  Effort: Pulmonary effort is normal.  Abdominal:     General: There is no distension.     Palpations: Abdomen is soft.     Tenderness: There is no abdominal tenderness.     Comments: Incision healing well.  Musculoskeletal: Normal range of motion.  Neurological:     Mental Status: She is alert and oriented to person, place, and time.     Cranial Nerves: No  cranial nerve deficit.  Skin:    General: Skin is warm and dry.   ASSESSMENT/PLAN:    Problem List Items Addressed This Visit      Other   Vaginal cuff cellulitis - Primary   History of hysterectomy    Other Visit Diagnoses    Fatigue, unspecified type       Relevant Orders   CBC    Plan [place] to assess fluid collection and its resolution Exam shows resolving cellulitis but with incomplete cuff healing at this time.    Need further time to heal.  Pelvic rest advised. Some of her sx's may be part of post op healing. WIll monitor odor or vag sx's, consider BV (no evidence today).  A total of 45 minutes were spent face-to-face with the patient during this encounter and over half of that time dealt with counseling and coordination of care.  Barnett Applebaum, MD, [facility], [facility] 03/01/2018  4:40 PM

## 2018-03-02 ENCOUNTER — Telehealth: Payer: Self-pay

## 2018-03-02 ENCOUNTER — Other Ambulatory Visit: Payer: Self-pay | Admitting: Obstetrics & Gynecology

## 2018-03-02 LAB — CBC
Hematocrit: 32.6 % — ABNORMAL LOW (ref 34.0–46.6)
Hemoglobin: 10.3 g/dL — ABNORMAL LOW (ref 11.1–15.9)
MCH: 24.9 pg — ABNORMAL LOW (ref 26.6–33.0)
MCHC: 31.6 g/dL (ref 31.5–35.7)
MCV: 79 fL (ref 79–97)
Platelets: 383 10*3/uL (ref 150–450)
RBC: 4.13 x10E6/uL (ref 3.77–5.28)
RDW: 16.8 % — ABNORMAL HIGH (ref 11.7–15.4)
WBC: 8.6 10*3/uL (ref 3.4–10.8)

## 2018-03-02 MED ORDER — FERRALET 90 90-1 MG PO TABS: 1.0000 | ORAL_TABLET | Freq: Every day | ORAL | 2 refills | Status: DC

## 2018-03-02 NOTE — Telephone Encounter (Signed)
Pt needs work note stating she can return to work 03/05/18 without restrictions. [phone]

## 2018-03-02 NOTE — Progress Notes (Signed)
Pt states she can not take iron orally

## 2018-03-02 NOTE — Telephone Encounter (Signed)
ok 

## 2018-03-02 NOTE — Progress Notes (Signed)
Let her know blood count was normal as far as infection is concerned but low in blood count, may benefit from iron therapy which I have called in for her.

## 2018-03-05 NOTE — Telephone Encounter (Signed)
Late entry:  Friday late pm pt stated she would need to call first thing this morning with fax number.

## 2018-03-05 NOTE — Telephone Encounter (Signed)
Pt calling to be sure we are on the same page regarding her work note.  She states she and Welda talked about her working 1/2 days until further notice.  [phone] or [phone]   Pt adv me to she needs work note to state 1/2 days.  Fax # [phone]  Attn. Butch Penny.  Faxed.

## 2018-03-06 ENCOUNTER — Other Ambulatory Visit: Payer: Self-pay | Admitting: Obstetrics & Gynecology

## 2018-03-06 DIAGNOSIS — R188 Other ascites: Secondary | ICD-10-CM

## 2018-03-06 MED ORDER — IRON DEXTRAN-FOLIC ACID-B12 100-1000-15 MG-MCG/5ML PO LIQD: 5.0000 mL | Freq: Every day | ORAL | 11 refills | Status: DC

## 2018-03-06 NOTE — Progress Notes (Signed)
ERx changes to liquid form

## 2018-03-06 NOTE — Progress Notes (Signed)
Pt states she will try and let us know how she does with it.

## 2018-03-08 ENCOUNTER — Ambulatory Visit: Payer: Managed Care, Other (non HMO) | Admitting: Obstetrics & Gynecology

## 2018-03-08 ENCOUNTER — Other Ambulatory Visit: Payer: Managed Care, Other (non HMO)

## 2018-03-14 ENCOUNTER — Ambulatory Visit (INDEPENDENT_AMBULATORY_CARE_PROVIDER_SITE_OTHER): Payer: Managed Care, Other (non HMO)

## 2018-03-14 ENCOUNTER — Ambulatory Visit (INDEPENDENT_AMBULATORY_CARE_PROVIDER_SITE_OTHER): Payer: Managed Care, Other (non HMO) | Admitting: Obstetrics & Gynecology

## 2018-03-14 ENCOUNTER — Encounter: Payer: Self-pay | Admitting: Obstetrics & Gynecology

## 2018-03-14 VITALS — BP 120/80 | Ht 65.0 in | Wt 203.0 lb

## 2018-03-14 DIAGNOSIS — N760 Acute vaginitis: Secondary | ICD-10-CM | POA: Diagnosis not present

## 2018-03-14 DIAGNOSIS — R5383 Other fatigue: Secondary | ICD-10-CM | POA: Diagnosis not present

## 2018-03-14 DIAGNOSIS — Z90710 Acquired absence of both cervix and uterus: Secondary | ICD-10-CM

## 2018-03-14 DIAGNOSIS — R188 Other ascites: Secondary | ICD-10-CM | POA: Diagnosis not present

## 2018-03-14 DIAGNOSIS — R102 Pelvic and perineal pain: Secondary | ICD-10-CM | POA: Diagnosis not present

## 2018-03-14 MED ORDER — VITAFOL GUMMIES 3.33-0.333-34.8 MG PO CHEW: 1.0000 | CHEWABLE_TABLET | Freq: Two times a day (BID) | ORAL | 3 refills | Status: DC

## 2018-03-14 NOTE — Progress Notes (Signed)
Follow-up Patient presents in follow up consultation from surgery done elsewhere,  post op from [facility] for abnormal uterine bleeding, 7 weeks ago.  Subjective: Patient reports some improvement in her preop symptoms. Eating a regular diet without difficulty. She has occas pains, some d/c, hot flashes still, and low energy.  Some of these are improved from before.  Min pain, mostly in RLQ area.  [place] today, see report.  Activity: normal activities of daily living. Patient reports additional symptom's since surgery of no bleeding except after ultrasound done today.  Objective: BP 120/80   Ht 5\' 5"  (1.651 m)   Wt 203 lb (92.1 kg)   LMP 03/22/2017   BMI 33.78 kg/m  Physical Exam Constitutional:      General: She is not in acute distress.    Appearance: She is well-developed.  Genitourinary:     Pelvic exam was performed with patient supine.     Vagina and rectum normal.     No vaginal erythema or bleeding.     No right or left adnexal mass present.     Right adnexa not tender.     Left adnexa not tender.     Genitourinary Comments: Cervix and uterus absent. Vaginal cuff healing well.  Cardiovascular:     Rate and Rhythm: Normal rate.  Pulmonary:     Effort: Pulmonary effort is normal.  Abdominal:     General: There is no distension.     Palpations: Abdomen is soft.     Tenderness: There is no abdominal tenderness.     Comments: Incision healing well.  Musculoskeletal: Normal range of motion.  Neurological:     Mental Status: She is alert and oriented to person, place, and time.     Cranial Nerves: No cranial nerve deficit.  Skin:    General: Skin is warm and dry.    US Pelvis Transvanginal Non-ob (tv Only)  Result Date: 03/14/2018 Patient Name: Abigail Montes DOB: 06/29/1980 MRN: [id number] ULTRASOUND REPORT Location: [facility] Date of Service: 03/14/2018 Indications:Pelvic Pain Findings: Hysterectomy Right Ovary measures 3.7 x 1.9 x 2.0cm. It is normal in appearance.  Left Ovary measures 4.0 x 2.6 x 1.8cm. It is normal in appearance. Survey of the adnexa demonstrates no adnexal masses. There is no free fluid in the cul de sac. Impression: 1. Hysterectomy. 2. Normal ovaries. Recommendations: 1.Clinical correlation with the patient's History and Physical Exam. Vita Barley, RDMS RVT Review of ULTRASOUND.    I have personally reviewed images and report of recent ultrasound done at [facility]    Plan of management to be discussed with patient. Barnett Applebaum, MD, [facility], [facility] 03/14/2018  11:20 AM   Assessment:  Vaginal cuff cellulitis  History of hysterectomy  Fatigue, unspecified type  s/p :  total laparoscopic hysterectomy with bilateral salpingectomy stable  Plan: Patient has done well after surgery with no apparent complications.  I have discussed the post-operative course to date, and the expected progress moving forward.  The patient understands what complications to be concerned about.  I will see the patient in routine follow up, or sooner if needed.    Activity plan: No restriction..  Pelvic rest but can resume sex soon and monitor for bleeding or discharge  Iron samples and Rx provided (gummies, cannot tolerate others)  Review of ULTRASOUND.    I have personally reviewed images and report of recent ultrasound done at [facility].    Plan of management to be discussed  with patient.  A total of 15 minutes were spent face-to-face with the patient during this encounter and over half of that time dealt with counseling and coordination of care.  Hoyt Koch 03/14/2018, 11:30 AM

## 2018-07-11 ENCOUNTER — Other Ambulatory Visit: Payer: Self-pay

## 2018-07-11 MED ORDER — GABAPENTIN 600 MG PO TABS: 600.0000 mg | ORAL_TABLET | Freq: Every day | ORAL | 2 refills | Status: DC

## 2018-07-11 NOTE — Telephone Encounter (Signed)
Pharmacy refill request for Gabapentin 600mg.  Per Dr. Hyatt's verbal order, ok to refill    Script has been sent to pharmacy 

## 2018-10-15 ENCOUNTER — Other Ambulatory Visit
Admission: RE | Admit: 2018-10-15 | Discharge: 2018-10-15 | Disposition: A | Discharge status: Home / Self Care | Payer: Managed Care, Other (non HMO) | Source: Ambulatory Visit | Attending: Pulmonary Disease | Admitting: Pulmonary Disease

## 2018-10-15 DIAGNOSIS — U071 COVID-19: Secondary | ICD-10-CM | POA: Diagnosis present

## 2018-10-15 DIAGNOSIS — R0602 Shortness of breath: Secondary | ICD-10-CM | POA: Diagnosis not present

## 2018-10-15 LAB — FIBRIN DERIVATIVES D-DIMER (ARMC ONLY): Fibrin derivatives D-dimer (ARMC): 310.29 ng/mL (FEU) (ref 0.00–499.00)

## 2019-02-12 ENCOUNTER — Other Ambulatory Visit (HOSPITAL_COMMUNITY)
Admission: RE | Admit: 2019-02-12 | Discharge: 2019-02-12 | Disposition: A | Discharge status: Home / Self Care | Payer: Managed Care, Other (non HMO) | Source: Ambulatory Visit | Attending: Obstetrics & Gynecology | Admitting: Obstetrics & Gynecology

## 2019-02-12 ENCOUNTER — Other Ambulatory Visit: Payer: Self-pay

## 2019-02-12 ENCOUNTER — Encounter: Payer: Self-pay | Admitting: Obstetrics & Gynecology

## 2019-02-12 ENCOUNTER — Ambulatory Visit (INDEPENDENT_AMBULATORY_CARE_PROVIDER_SITE_OTHER): Payer: Managed Care, Other (non HMO) | Admitting: Obstetrics & Gynecology

## 2019-02-12 VITALS — BP 100/60 | Ht 65.0 in | Wt 174.0 lb

## 2019-02-12 DIAGNOSIS — N761 Subacute and chronic vaginitis: Secondary | ICD-10-CM | POA: Insufficient documentation

## 2019-02-12 DIAGNOSIS — L292 Pruritus vulvae: Secondary | ICD-10-CM | POA: Insufficient documentation

## 2019-02-12 MED ORDER — TERCONAZOLE 0.4 % VA CREA: 1.0000 | TOPICAL_CREAM | Freq: Every day | VAGINAL | 0 refills | Status: DC

## 2019-02-12 MED ORDER — CLOTRIMAZOLE-BETAMETHASONE 1-0.05 % EX CREA: 1.0000 "application " | TOPICAL_CREAM | Freq: Two times a day (BID) | CUTANEOUS | 0 refills | Status: DC

## 2019-02-12 NOTE — Patient Instructions (Signed)
Terconazole vaginal cream What is this medicine? TERCONAZOLE (ter KON a zole) is an antifungal medicine. It is used to treat yeast infections of the vagina. This medicine may be used for other purposes; ask your health care provider or pharmacist if you have questions. COMMON BRAND NAME(S): Terazol 3, Terazol 7, Zazole What should I tell my health care provider before I take this medicine? They need to know if you have any of these conditions:  an unusual or allergic reaction to terconazole, other antifungals, other medicines, foods, dyes or preservatives  pregnant or trying to get pregnant  breast-feeding How should I use this medicine? This medicine is only for use in the vagina. Do not take by mouth. Wash hands before and after use. Read package directions carefully before using. Use this medicine at bedtime, unless otherwise directed by your doctor or health care professional. Screw the applicator onto the end of the tube and squeeze the tube to fill the applicator. Remove the applicator from the tube. Lie on your back. Gently insert the applicator tip high in the vagina and push the plunger to release the cream into the vagina. Gently remove the applicator. Wash the applicator well with warm water and soap. Use at regular intervals. Do not get this medicine in your eyes. If you do, rinse out with plenty of cool tap water. Finish the full course prescribed by your doctor or health care professional even if you think your condition is better. Do not stop using this medicine if your menstrual period starts during the time of treatment. Talk to your pediatrician regarding the use of this medicine in children. Special care may be needed. Overdosage: If you think you have taken too much of this medicine contact a poison control center or emergency room at once. NOTE: This medicine is only for you. Do not share this medicine with others. What if I miss a dose? If you miss a dose, use it as soon as  you can. If it is almost time for your next dose, use only that dose. Do not use double or extra doses. What may interact with this medicine? Interactions are not expected. Do not use any other vaginal products without telling your doctor or health care professional. This list may not describe all possible interactions. Give your health care provider a list of all the medicines, herbs, non-prescription drugs, or dietary supplements you use. Also tell them if you smoke, drink alcohol, or use illegal drugs. Some items may interact with your medicine. What should I watch for while using this medicine? Tell your doctor or health care professional if your symptoms do not start to get better within a few days. It is better not to have sex until you have finished your treatment. If you have sex, your partner should use a condom during sex to help prevent transfer of the infection. Your sexual partner may also need treatment. Vaginal medicines usually will come out of the vagina during treatment. To keep the medicine from getting on your clothing, wear a mini-pad or sanitary napkin. The use of tampons is not recommended since they may soak up the medicine. To help clear up the infection, wear freshly washed cotton, not synthetic, underwear. What side effects may I notice from receiving this medicine? Side effects that you should report to your doctor or health care professional as soon as possible:  painful or difficult urination  vaginal pain Side effects that usually do not require medical attention (report to your doctor  or health care professional if they continue or are bothersome):  headache  menstrual pain  stomach upset  vaginal irritation, itching or burning This list may not describe all possible side effects. Call your doctor for medical advice about side effects. You may report side effects to FDA at 1-800-FDA-1088. Where should I keep my medicine? Keep out of the reach of  children. Store at room temperature between 15 and 30 degrees C (59 and 86 degrees F). Throw away any unused medicine after the expiration date. NOTE: This sheet is a summary. It may not cover all possible information. If you have questions about this medicine, talk to your doctor, pharmacist, or health care provider.  2020 Elsevier/Gold Standard (2007-09-26 13:51:27)  Betamethasone; Clotrimazole skin cream What is this medicine? BETAMETHASONE; CLOTRIMAZOLE (bay ta METH a sone; kloe TRIM a zole) is a corticosteroid and antifungal cream. It treats ringworm and infections like jock itch and athlete's foot. It also helps reduce swelling, redness, and itching caused by these infections. This medicine may be used for other purposes; ask your health care provider or pharmacist if you have questions. COMMON BRAND NAME(S): Lotrisone What should I tell my health care provider before I take this medicine? They need to know if you have any of these conditions:  large areas of burned or damaged skin  skin thinning  peripheral vascular disease or poor circulation  an unusual or allergic reaction to betamethasone, clotrimazole, other corticosteroids, other antifungals, other medicines, foods, dyes, or preservatives  pregnant or trying to get pregnant  breast-feeding How should I use this medicine? This cream is for external use only. Do not take by mouth. Follow the directions on the prescription label. Wash your hands before and after use. If treating hand or nail infections, wash hands before use only. Apply a thin layer of cream to the affected area and rub in gently. Do not cover or wrap the treated area with an airtight bandage (like a plastic bandage). Use the cream for the full course of treatment prescribed, even if you think the condition is getting better. Use the medicine at regular intervals. Do not use more often than directed. Do not use on healthy skin or over large areas of skin. Do not  use this medicine for any condition other than the one for which it was prescribed. When applying to the groin area, apply a small amount and do not use for longer than 2 weeks unless directed to by your doctor or health care professional. Do not get this cream in your eyes. If you do, rinse out with plenty of cool tap water. Talk to your pediatrician regarding the use of this medicine in children. While this drug may be prescribed for children as young as 17 years for selected conditions, precautions do apply. Patients over 82 years old may have a stronger reaction and need a smaller dose. Overdosage: If you think you have taken too much of this medicine contact a poison control center or emergency room at once. NOTE: This medicine is only for you. Do not share this medicine with others. What if I miss a dose? If you miss a dose, use it as soon as you can. If it is almost time for your next dose, use only that dose. Do not use double or take extra doses. What may interact with this medicine?  topical products that have nystatin This list may not describe all possible interactions. Give your health care provider a list of all the  medicines, herbs, non-prescription drugs, or dietary supplements you use. Also tell them if you smoke, drink alcohol, or use illegal drugs. Some items may interact with your medicine. What should I watch for while using this medicine? If using this medicine on your body or groin tell your doctor or health care professional if your symptoms do not improve within 1 week. If using this medicine on your feet tell your doctor or health care professional if your symptoms do not improve within 2 weeks. Tell your doctor if your skin infection returns after you stop using this cream. If you are using this cream for 'jock itch' be sure to dry the groin completely after bathing. Do not wear underwear that is tight-fitting or made from synthetic fibers like rayon or nylon. Wear  loose-fitting, cotton underwear. If you are using this cream for athlete's foot be sure to dry your feet carefully after bathing, especially between the toes. Do not wear socks made from wool or synthetic materials like rayon or nylon. Wear clean cotton socks and change them at least once a day, change them more if your feet sweat a lot. Also, try to wear sandals or shoes that are well-ventilated. Do not use this cream to treat diaper rash. What side effects may I notice from receiving this medicine? Side effects that you should report to your doctor or health care professional as soon as possible:  allergic reactions like skin rash, itching or hives, swelling of the face, lips, or tongue  dark red spots on the skin  lack of healing of skin condition  loss of feeling on skin  painful, red, pus-filled blisters in hair follicles  skin infection  sores or blisters that do not heal properly  thinning of the skin or sunburn Side effects that usually do not require medical attention (report to your doctor or health care professional if they continue or are bothersome):  dry or peeling skin  minor skin irritation, burning, or itching This list may not describe all possible side effects. Call your doctor for medical advice about side effects. You may report side effects to FDA at 1-800-FDA-1088. Where should I keep my medicine? Keep out of the reach of children. Store at room temperature between 15 and 30 degrees C ( 59 and 86 degrees F). Do not freeze. Throw away any unused medicine after the expiration date. NOTE: This sheet is a summary. It may not cover all possible information. If you have questions about this medicine, talk to your doctor, pharmacist, or health care provider.  2020 Elsevier/Gold Standard (2007-04-11 16:14:28)

## 2019-02-12 NOTE — Progress Notes (Signed)
HPI:      [name] is a 39 y.o. G3P3 who LMP was Patient's last menstrual period was 01/22/2018., presents today for a problem visit.  She complains of:  Vaginitis: Patient complains of an abnormal vaginal discharge since she was treated for Covid in Sept, with steroids and ABX, and has been on ABX as well for UTI recently.  SHe has been having problems with discharge and itching.  Also now having left sided vulvar itch and burn as well, even burns to Monistat touch.  Has been on Diflucan several times; helps but then relapses. Vaginal symptoms include burning, discharge described as white, clear, creamy and odorless, local irritation and vulvar itching.Vulvar symptoms include left sided patchy area.STI Risk: Very low risk of STD exposureDischarge described as: white and odorless.Other associated symptoms: none.Menstrual pattern: She had been bleeding none (prior hyst). Contraception: status post hysterectomy  PMHx: She  has a past medical history of Anxiety, Arthritis, Complication of anesthesia, PONV (postoperative nausea and vomiting), and Recurrent UTI. Also,  has a past surgical history that includes Foot Tendon Surgery (Right, 08/2013); Tubal ligation (08/2003); Breast cyst aspiration (Left, 2016); Foot surgery (Left, 2017); Laparoscopic hysterectomy (N/A, 01/26/2018); Laparoscopic bilateral salpingectomy (Bilateral, 01/26/2018); and Abdominal hysterectomy (01/26/2018)., family history includes Cancer in her maternal aunt; Diabetes in her father; Heart attack in her father; Kidney disease in her father.,  reports that she has never smoked. She has never used smokeless tobacco. She reports current alcohol use. She reports that she does not use drugs.  She has a current medication list which includes the following prescription(s): b-12, gabapentin, ibuprofen, iron dextran-folic [id number], omeprazole, clotrimazole-betamethasone, phentermine, vitafol gummies, and terconazole. Also, is allergic to  ciprofloxacin; tape; latex; and macrobid [nitrofurantoin].  Review of Systems  Constitutional: Positive for malaise/fatigue. Negative for chills and fever.  HENT: Negative for congestion, sinus pain and sore throat.   Eyes: Negative for blurred vision and pain.  Respiratory: Negative for cough and wheezing.   Cardiovascular: Negative for chest pain and leg swelling.  Gastrointestinal: Negative for abdominal pain, constipation, diarrhea, heartburn, nausea and vomiting.  Genitourinary: Positive for frequency. Negative for dysuria, hematuria and urgency.  Musculoskeletal: Negative for back pain, joint pain, myalgias and neck pain.  Skin: Negative for itching and rash.  Neurological: Negative for dizziness, tremors and weakness.  Endo/Heme/Allergies: Bruises/bleeds easily.  Psychiatric/Behavioral: Negative for depression. The patient is nervous/anxious. The patient does not have insomnia.     Objective: BP 100/60   Ht 5\' 5"  (1.651 m)   Wt 174 lb (78.9 kg)   LMP 01/22/2018   BMI 28.96 kg/m  Physical Exam Constitutional:      General: She is not in acute distress.    Appearance: She is well-developed.  Genitourinary:     Pelvic exam was performed with patient supine.     Vagina normal.     No vaginal erythema or bleeding.     Genitourinary Comments: Cuff intact/ no lesions  Absent uterus and cervix  HENT:     Head: Normocephalic and atraumatic.     Nose: Nose normal.  Abdominal:     General: There is no distension.     Palpations: Abdomen is soft.     Tenderness: There is no abdominal tenderness.  Musculoskeletal:        General: Normal range of motion.  Neurological:     Mental Status: She is alert and oriented to person, place, and time.     Cranial Nerves: No cranial  nerve deficit.  Skin:    General: Skin is warm and dry.  Psychiatric:        Attention and Perception: Attention normal.        Mood and Affect: Mood normal.        Speech: Speech normal.         Behavior: Behavior normal.        Cognition and Memory: Cognition normal.        Judgment: Judgment normal.     ASSESSMENT/PLAN:    Problem List Items Addressed This Visit      Musculoskeletal and Integument   Vulvar itching   Relevant Medications   clotrimazole-betamethasone (LOTRISONE) cream   Other Relevant Orders   Cervicovaginal ancillary only     Genitourinary   Chronic vaginitis - Primary   Relevant Medications   terconazole (TERAZOL 7) 0.4 % vaginal cream   Other Relevant Orders   Cervicovaginal ancillary only    A total of 20 minutes were spent face-to-face with the patient as well as preparation, review, communication, and documentation during this encounter.   Tx w meds int and ext.  Monitor for resolution Annual soon  Barnett Applebaum, MD, [facility], [facility] 02/12/2019  10:25 AM

## 2019-02-14 LAB — CERVICOVAGINAL ANCILLARY ONLY
Bacterial Vaginitis (gardnerella): NEGATIVE
Candida Glabrata: NEGATIVE
Candida Vaginitis: NEGATIVE
Comment: NEGATIVE
Comment: NEGATIVE
Comment: NEGATIVE

## 2019-04-08 ENCOUNTER — Other Ambulatory Visit: Payer: Self-pay | Admitting: Podiatry

## 2019-04-15 ENCOUNTER — Ambulatory Visit: Payer: Managed Care, Other (non HMO) | Admitting: Obstetrics & Gynecology

## 2019-05-21 ENCOUNTER — Ambulatory Visit: Payer: Managed Care, Other (non HMO) | Admitting: Obstetrics & Gynecology

## 2019-06-20 ENCOUNTER — Ambulatory Visit (INDEPENDENT_AMBULATORY_CARE_PROVIDER_SITE_OTHER): Payer: Managed Care, Other (non HMO) | Admitting: Obstetrics & Gynecology

## 2019-06-20 ENCOUNTER — Encounter: Payer: Self-pay | Admitting: Obstetrics & Gynecology

## 2019-06-20 ENCOUNTER — Other Ambulatory Visit: Payer: Self-pay

## 2019-06-20 VITALS — BP 118/80 | Ht 65.0 in | Wt 173.0 lb

## 2019-06-20 DIAGNOSIS — Z1231 Encounter for screening mammogram for malignant neoplasm of breast: Secondary | ICD-10-CM | POA: Diagnosis not present

## 2019-06-20 DIAGNOSIS — L292 Pruritus vulvae: Secondary | ICD-10-CM

## 2019-06-20 DIAGNOSIS — Z01419 Encounter for gynecological examination (general) (routine) without abnormal findings: Secondary | ICD-10-CM

## 2019-06-20 MED ORDER — CLOTRIMAZOLE-BETAMETHASONE 1-0.05 % EX CREA: 1.0000 "application " | TOPICAL_CREAM | Freq: Two times a day (BID) | CUTANEOUS | 0 refills | Status: AC | PRN

## 2019-06-20 NOTE — Patient Instructions (Signed)
PAP every 5 years Mammogram every year    Call [phone] to schedule at [facility] yearly (with PCP)

## 2019-06-20 NOTE — Progress Notes (Signed)
HPI:      [name] is a 38 y.o. G3P3 who LMP was Patient's last menstrual period was 01/22/2018., she has had a hysterectomy last year.  She presents today for her annual examination. The patient has no complaints today, other than regular breast T since breast reduction surgery 3 years ago; also occas RLQ pain since her hysterectomy, had h/o right ovarian cyst in past, pain is intermittent and mild.  The patient is sexually active. Her last pap: was normal and last mammogram: patient has never had a mammogram. The patient does perform self breast exams.  There is notable family history of breast or ovarian cancer in her family.  The patient has regular exercise: yes.  The patient denies current symptoms of depression.    GYN History: Contraception: status post hysterectomy  PMHx: Past Medical History:  Diagnosis Date  . Anxiety   . Arthritis   . Complication of anesthesia   . PONV (postoperative nausea and vomiting)   . Recurrent UTI    Past Surgical History:  Procedure Laterality Date  . ABDOMINAL HYSTERECTOMY  01/26/2018  . BREAST CYST ASPIRATION Left 2016  . FOOT SURGERY Left 2017  . FOOT TENDON SURGERY Right 08/2013   torn tendon  . LAPAROSCOPIC BILATERAL SALPINGECTOMY Bilateral 01/26/2018   Procedure: LAPAROSCOPIC BILATERAL SALPINGECTOMY;  Surgeon: Ward, Chelsea C, MD;  Location: [facility] ORS;  Service: Gynecology;  Laterality: Bilateral;  . LAPAROSCOPIC HYSTERECTOMY N/A 01/26/2018   Procedure: HYSTERECTOMY TOTAL LAPAROSCOPIC;  Surgeon: Ward, Chelsea C, MD;  Location: [facility] ORS;  Service: Gynecology;  Laterality: N/A;  . TUBAL LIGATION  08/2003   Family History  Problem Relation Age of Onset  . Diabetes Father   . Heart attack Father   . Kidney disease Father   . Cancer Maternal Aunt   . Bladder Cancer Neg Hx    Social History   Tobacco Use  . Smoking status: Never Smoker  . Smokeless tobacco: Never Used  Substance Use Topics  . Alcohol use: Yes    Comment: rare   . Drug use: No    Current Outpatient Medications:  .  Cyanocobalamin (B-12) 1000 MCG/ML KIT, Inject 1 mL as directed every 30 (thirty) days., Disp: , Rfl:  .  gabapentin (NEURONTIN) 600 MG tablet, TAKE ONE TABLET AT BEDTIME, Disp: 90 tablet, Rfl: 0 .  clotrimazole-betamethasone (LOTRISONE) cream, Apply 1 application topically 2 (two) times daily as needed., Disp: 30 g, Rfl: 0 .  omeprazole (PRILOSEC OTC) 20 MG tablet, Take 20 mg by mouth daily., Disp: , Rfl:  .  phentermine (ADIPEX-P) 37.5 MG tablet, Take by mouth., Disp: , Rfl:  Allergies: Ciprofloxacin, Tape, Latex, and Macrobid [nitrofurantoin]  Review of Systems  Constitutional: Negative for chills, fever and malaise/fatigue.  HENT: Negative for congestion, sinus pain and sore throat.   Eyes: Negative for blurred vision and pain.  Respiratory: Negative for cough and wheezing.   Cardiovascular: Negative for chest pain and leg swelling.  Gastrointestinal: Negative for abdominal pain, constipation, diarrhea, heartburn, nausea and vomiting.  Genitourinary: Negative for dysuria, frequency, hematuria and urgency.  Musculoskeletal: Negative for back pain, joint pain, myalgias and neck pain.  Skin: Negative for itching and rash.  Neurological: Negative for dizziness, tremors and weakness.  Endo/Heme/Allergies: Does not bruise/bleed easily.  Psychiatric/Behavioral: Negative for depression. The patient is not nervous/anxious and does not have insomnia.     Objective: BP 118/80   Ht 5' 5" (1.651 m)   Wt 173 lb (78.5 kg)     LMP 01/22/2018   BMI 28.79 kg/m   Filed Weights   06/20/19 1418  Weight: 173 lb (78.5 kg)   Body mass index is 28.79 kg/m. Physical Exam Constitutional:      General: She is not in acute distress.    Appearance: She is well-developed.  Genitourinary:     Pelvic exam was performed with patient supine.     Vagina and rectum normal.     No lesions in the vagina.     No vaginal bleeding.     No right or left  adnexal mass present.     Right adnexa not tender.     Left adnexa not tender.     Genitourinary Comments: Absent Uterus Absent cervix Vaginal cuff well healed No mass in right adnexa area specifically  HENT:     Head: Normocephalic and atraumatic. No laceration.     Right Ear: Hearing normal.     Left Ear: Hearing normal.     Mouth/Throat:     Pharynx: Uvula midline.  Eyes:     Pupils: Pupils are equal, round, and reactive to light.  Neck:     Thyroid: No thyromegaly.  Cardiovascular:     Rate and Rhythm: Normal rate and regular rhythm.     Heart sounds: No murmur. No friction rub. No gallop.   Pulmonary:     Effort: Pulmonary effort is normal. No respiratory distress.     Breath sounds: Normal breath sounds. No wheezing.  Chest:     Breasts:        Right: No mass, skin change or tenderness.        Left: No mass, skin change or tenderness.  Abdominal:     General: Bowel sounds are normal. There is no distension.     Palpations: Abdomen is soft.     Tenderness: There is no abdominal tenderness. There is no rebound.  Musculoskeletal:        General: Normal range of motion.     Cervical back: Normal range of motion and neck supple.  Neurological:     Mental Status: She is alert and oriented to person, place, and time.     Cranial Nerves: No cranial nerve deficit.  Skin:    General: Skin is warm and dry.  Psychiatric:        Judgment: Judgment normal.  Vitals reviewed.     Assessment:  ANNUAL EXAM 1. Women's annual routine gynecological examination   2. Encounter for screening mammogram for malignant neoplasm of breast   3. Vulvar itching      Screening Plan:            1.  Vaginal Screening-  Pap smear recommended every 5 years (due 2025)  2. Breast screening- Exam annually and mammogram>40 planned   3. Colonoscopy every 10 years, Hemoccult testing - after age 50  4. Labs managed by PCP  5. Counseling for contraception: no method   6. Vulvar itching,  usually just after sex - medicine has helped when used for 1-2 days after sex causes sx's - clotrimazole-betamethasone (LOTRISONE) cream; Apply 1 application topically 2 (two) times daily as needed.  Dispense: 30 g; Refill: 0      F/U  Return in about 1 year (around 06/19/2020) for Annual.  Paul Harris, MD, FACOG [facility], [facility] 06/20/2019  2:57 PM   

## 2019-07-12 ENCOUNTER — Encounter: Payer: Self-pay | Admitting: Obstetrics and Gynecology

## 2019-08-07 ENCOUNTER — Other Ambulatory Visit: Payer: Self-pay | Admitting: Podiatry

## 2019-08-22 ENCOUNTER — Inpatient Hospital Stay
Admission: RE | Admit: 2019-08-22 | Discharge: 2019-08-22 | Disposition: A | Discharge status: Home / Self Care | Payer: Self-pay | Source: Ambulatory Visit | Attending: *Deleted | Admitting: *Deleted

## 2019-08-22 ENCOUNTER — Other Ambulatory Visit: Payer: Self-pay | Admitting: *Deleted

## 2019-08-22 DIAGNOSIS — Z1231 Encounter for screening mammogram for malignant neoplasm of breast: Secondary | ICD-10-CM

## 2019-09-10 ENCOUNTER — Other Ambulatory Visit: Payer: Self-pay | Admitting: Obstetrics & Gynecology

## 2019-09-10 DIAGNOSIS — Z1231 Encounter for screening mammogram for malignant neoplasm of breast: Secondary | ICD-10-CM

## 2019-09-16 ENCOUNTER — Other Ambulatory Visit: Payer: Self-pay | Admitting: Obstetrics & Gynecology

## 2019-09-16 DIAGNOSIS — Z1231 Encounter for screening mammogram for malignant neoplasm of breast: Secondary | ICD-10-CM

## 2019-09-26 ENCOUNTER — Ambulatory Visit
Admission: RE | Admit: 2019-09-26 | Discharge: 2019-09-26 | Disposition: A | Discharge status: Home / Self Care | Payer: Managed Care, Other (non HMO) | Source: Ambulatory Visit | Attending: Obstetrics & Gynecology | Admitting: Obstetrics & Gynecology

## 2019-09-26 ENCOUNTER — Other Ambulatory Visit: Payer: Self-pay

## 2019-09-26 DIAGNOSIS — Z1231 Encounter for screening mammogram for malignant neoplasm of breast: Secondary | ICD-10-CM | POA: Diagnosis present

## 2019-10-01 ENCOUNTER — Other Ambulatory Visit: Payer: Self-pay | Admitting: Obstetrics & Gynecology

## 2019-10-01 DIAGNOSIS — R928 Other abnormal and inconclusive findings on diagnostic imaging of breast: Secondary | ICD-10-CM

## 2019-10-01 DIAGNOSIS — N6489 Other specified disorders of breast: Secondary | ICD-10-CM

## 2019-10-14 ENCOUNTER — Ambulatory Visit
Admission: RE | Admit: 2019-10-14 | Discharge: 2019-10-14 | Disposition: A | Discharge status: Home / Self Care | Payer: Managed Care, Other (non HMO) | Source: Ambulatory Visit | Attending: Obstetrics & Gynecology | Admitting: Obstetrics & Gynecology

## 2019-10-14 ENCOUNTER — Other Ambulatory Visit: Payer: Self-pay

## 2019-10-14 DIAGNOSIS — R928 Other abnormal and inconclusive findings on diagnostic imaging of breast: Secondary | ICD-10-CM | POA: Insufficient documentation

## 2019-10-14 DIAGNOSIS — N6489 Other specified disorders of breast: Secondary | ICD-10-CM | POA: Diagnosis present

## 2020-02-11 ENCOUNTER — Other Ambulatory Visit
Admission: RE | Admit: 2020-02-11 | Discharge: 2020-02-11 | Disposition: A | Discharge status: Home / Self Care | Payer: Managed Care, Other (non HMO) | Source: Ambulatory Visit | Attending: Family Medicine | Admitting: Family Medicine

## 2020-02-11 DIAGNOSIS — R079 Chest pain, unspecified: Secondary | ICD-10-CM | POA: Diagnosis not present

## 2020-02-11 LAB — TROPONIN I (HIGH SENSITIVITY): Troponin I (High Sensitivity): 3 ng/L (ref <18)

## 2020-07-06 NOTE — Progress Notes (Signed)
07/07/2020 2:29 PM   Abigail Montes [birth year] [id number]  Referring provider: Idelle Crouch, MD [place] [facility],  [place] [address]  No chief complaint on file.   HPI: 40 year old female who presents today for further evaluation of urinary symptoms.  She was seen and evaluated in urgent care on 05/23/2020 with frankly positive urinalysis consistent with infection which is nitrate positive, many bacteria leukocytes, etc.  She ultimately grew Klebsiella for which she was appropriately treated.  Notably, this was resistant to ampicillin but otherwise pansensitive.  At the time, she was experiencing dysuria.  She was prescribed Omnicef for 7 days.  A few days after completing treatment, she messaged her primary care physician indicating that she started having back pain again, malodorous urine and dysuria.  She dropped off another urine on 06/05/2018 which did show many WBCs and bacteria, grew the same Klebsiella bug.  She was prescribed Ceftin.  She continues have persistent urinary symptoms.  She had a urine recheck on 06/25/2020 at which time her urine was actually completely negative.  On this occasion, she grew E. coli resistant to ceftriaxone, Cipro, gentamicin, Levaquin, and only intermediately sensitive to Augmentin.  After this culture, she ended up taking 1 dose of fosfomycin her symptoms improved and have not recurred.  Notably, she was post take 2 doses of fosfomycin however had severe nausea vomiting from the dose and thus elected not to repeat it.  Notably, she was seen and evaluated in our office for the same issue back in 2017.  Most recent imaging in the form of CT abdomen pelvis with contrast in 01/2018 without any GU pathology.  Today, she notes that her urine was cloudy for the first time again but she denies any dysuria or any other UTI symptoms.  She is status post hysterectomy but still has her ovaries.  She continues to have what she  believes are cycles.  She does have some mild vaginal dryness which she is going to discuss with her OB/GYN.   PMH: Past Medical History:  Diagnosis Date   Anxiety    Arthritis    Complication of anesthesia    Family history of breast cancer    6/21 genetic testing letter sent   PONV (postoperative nausea and vomiting)    Recurrent UTI     Surgical History: Past Surgical History:  Procedure Laterality Date   ABDOMINAL HYSTERECTOMY  01/26/2018   BREAST CYST ASPIRATION Left 2016   FOOT SURGERY Left 2017   FOOT TENDON SURGERY Right 08/2013   torn tendon   LAPAROSCOPIC BILATERAL SALPINGECTOMY Bilateral 01/26/2018   Procedure: LAPAROSCOPIC BILATERAL SALPINGECTOMY;  Surgeon: Maceo Pro, MD;  Location: [facility] ORS;  Service: Gynecology;  Laterality: Bilateral;   LAPAROSCOPIC HYSTERECTOMY N/A 01/26/2018   Procedure: HYSTERECTOMY TOTAL LAPAROSCOPIC;  Surgeon: Ward, Honor Loh, MD;  Location: [facility] ORS;  Service: Gynecology;  Laterality: N/A;   REDUCTION MAMMAPLASTY Bilateral 2017   TUBAL LIGATION  08/2003    Home Medications:  Allergies as of 07/07/2020       Reactions   Ciprofloxacin    TENDON PAIN   Tape Itching   Latex Rash   Macrobid [nitrofurantoin] Swelling        Medication List        Accurate as of July 06, 2020  2:29 PM. If you have any questions, ask your nurse or doctor.          B-12 1000 MCG/ML Kit Inject 1 mL  as directed every 30 (thirty) days.   clotrimazole-betamethasone cream Commonly known as: Lotrisone Apply 1 application topically 2 (two) times daily as needed.   gabapentin 600 MG tablet Commonly known as: NEURONTIN TAKE ONE TABLET AT BEDTIME   omeprazole 20 MG tablet Commonly known as: PRILOSEC OTC Take 20 mg by mouth daily.   phentermine 37.5 MG tablet Commonly known as: ADIPEX-P Take by mouth.        Allergies:  Allergies  Allergen Reactions   Ciprofloxacin     TENDON PAIN   Tape Itching   Latex Rash   Macrobid  [Nitrofurantoin] Swelling    Family History: Family History  Problem Relation Age of Onset   Diabetes Father    Heart attack Father    Kidney disease Father    Breast cancer Maternal Aunt 38   Bladder Cancer Neg Hx     Social History:  reports that she has never smoked. She has never used smokeless tobacco. She reports current alcohol use. She reports that she does not use drugs.   Physical Exam: LMP 01/22/2018   Constitutional:  Alert and oriented, No acute distress. HEENT: [place] AT, moist mucus membranes.  Trachea midline, no masses. Cardiovascular: No clubbing, cyanosis, or edema. Respiratory: Normal respiratory effort, no increased work of breathing. Skin: No rashes, bruises or suspicious lesions. Neurologic: Grossly intact, no focal deficits, moving all 4 extremities. Psychiatric: Normal mood and affect.  Laboratory Data: Lab Results  Component Value Date   WBC 8.6 03/01/2018   HGB 10.3 (L) 03/01/2018   HCT 32.6 (L) 03/01/2018   MCV 79 03/01/2018   PLT 383 03/01/2018    Lab Results  Component Value Date   CREATININE 0.80 02/01/2018    Urinalysis UA today is completely negative on dip, microscopy is also negative with some hyaline granular casts and moderate bacteria, otherwise unremarkable   Assessment & Plan:    1. Recurrent UTI Cath UA today is completely negative, no evidence of ongoing infection  She does note cloudy urine today but otherwise has no signs or symptoms of UTI  Based on her previous urinalysis with a negative UA and still grew E. coli, she may have a component of chronic bacteriuria  We discussed UTI prevention strategies including cranberry tablets twice daily, daily probiotic as well as consideration of d-mannose.  Like to try to avoid putting her on suppressive antibiotics especially given her multidrug-resistant bacterial growth, will consider for the future if she continues to have infections  She will present to our office for same  or next day visit for urinalysis when she has signs or symptoms of infection  Lastly, we discussed topical estrogen cream.  Certainly, there is data to support its use in postmenopausal women but she is likely pre or perimenopausal.  This being said, its fairly low risk medication, discussed using a 3 times per week, pea-sized not per urethral meatus and precautions.  She like to hold off for now but may consider for the future if she continues to have symptoms  Will escalate evaluation considering cystoscopy and or suppressive antibiotics if she continues to have recurrent symptoms - Urinalysis, Complete  2. Vaginal dryness As above   Hollice Espy, MD  [facility] [address] [place], [place] [address] [phone]  I spent 46 total minutes on the day of the encounter including pre-visit review of the medical record, face-to-face time with the patient, and post visit ordering of labs/imaging/tests.

## 2020-07-07 ENCOUNTER — Other Ambulatory Visit: Payer: Self-pay

## 2020-07-07 ENCOUNTER — Encounter: Payer: Self-pay | Admitting: Urology

## 2020-07-07 ENCOUNTER — Ambulatory Visit: Payer: Managed Care, Other (non HMO) | Admitting: Urology

## 2020-07-07 VITALS — BP 139/85 | HR 86 | Ht 64.0 in | Wt 152.0 lb

## 2020-07-07 DIAGNOSIS — N898 Other specified noninflammatory disorders of vagina: Secondary | ICD-10-CM | POA: Diagnosis not present

## 2020-07-07 DIAGNOSIS — N390 Urinary tract infection, site not specified: Secondary | ICD-10-CM

## 2020-07-07 LAB — URINALYSIS, COMPLETE
Bilirubin, UA: NEGATIVE
Glucose, UA: NEGATIVE
Ketones, UA: NEGATIVE
Leukocytes,UA: NEGATIVE
Nitrite, UA: NEGATIVE
Protein,UA: NEGATIVE
RBC, UA: NEGATIVE
Specific Gravity, UA: >1.03 — ABNORMAL HIGH (ref 1.005–1.030)
Urobilinogen, Ur: 0.2 mg/dL (ref 0.2–1.0)
pH, UA: 5 (ref 5.0–7.5)

## 2020-07-07 LAB — MICROSCOPIC EXAMINATION: Bacteria, UA: NONE SEEN

## 2020-07-07 NOTE — Patient Instructions (Signed)
Please take cranberry tablets twice daily  Continue daily probiotic or yogurt  D- mannose may be helpful  If you want to try vaginal estrogen, let me know

## 2020-07-09 ENCOUNTER — Encounter: Payer: Self-pay | Admitting: Urology

## 2020-07-09 ENCOUNTER — Other Ambulatory Visit: Payer: Self-pay

## 2020-07-09 ENCOUNTER — Other Ambulatory Visit: Payer: Managed Care, Other (non HMO)

## 2020-07-09 DIAGNOSIS — N390 Urinary tract infection, site not specified: Secondary | ICD-10-CM

## 2020-07-09 NOTE — Telephone Encounter (Signed)
Patient contacted and added to the lab schedule for today for a UA & culture drop off - will call with results.

## 2020-07-10 LAB — URINALYSIS, COMPLETE
Bilirubin, UA: NEGATIVE
Glucose, UA: NEGATIVE
Ketones, UA: NEGATIVE
Nitrite, UA: NEGATIVE
Protein,UA: NEGATIVE
Specific Gravity, UA: 1.01 (ref 1.005–1.030)
Urobilinogen, Ur: 0.2 mg/dL (ref 0.2–1.0)
pH, UA: 6 (ref 5.0–7.5)

## 2020-07-10 LAB — MICROSCOPIC EXAMINATION

## 2020-07-13 ENCOUNTER — Other Ambulatory Visit: Payer: Self-pay | Admitting: Urology

## 2020-07-13 MED ORDER — SULFAMETHOXAZOLE-TRIMETHOPRIM 800-160 MG PO TABS: 1.0000 | ORAL_TABLET | Freq: Two times a day (BID) | ORAL | 0 refills | Status: AC

## 2020-07-14 ENCOUNTER — Telehealth: Payer: Self-pay | Admitting: *Deleted

## 2020-07-14 LAB — CULTURE, URINE COMPREHENSIVE

## 2020-07-14 NOTE — Telephone Encounter (Addendum)
Left patient VM with details asked to return call with any questions.   ----- Message from Hollice Espy, MD sent at 07/14/2020  1:42 PM EDT ----- Bactrim should work, sent to pharmacy yesterday with mychart message  Hollice Espy, MD

## 2021-06-18 ENCOUNTER — Other Ambulatory Visit: Payer: Self-pay | Admitting: Internal Medicine

## 2021-06-18 DIAGNOSIS — Z1231 Encounter for screening mammogram for malignant neoplasm of breast: Secondary | ICD-10-CM

## 2021-08-05 ENCOUNTER — Ambulatory Visit
Admission: RE | Admit: 2021-08-05 | Discharge: 2021-08-05 | Disposition: A | Discharge status: Home / Self Care | Payer: Managed Care, Other (non HMO) | Source: Ambulatory Visit | Attending: Internal Medicine | Admitting: Internal Medicine

## 2021-08-05 DIAGNOSIS — Z1231 Encounter for screening mammogram for malignant neoplasm of breast: Secondary | ICD-10-CM | POA: Insufficient documentation

## 2022-01-19 IMAGING — MG DIGITAL SCREENING BILAT W/ TOMO W/ CAD
8 series · 8 of 24 positions shown · non-contrast
Comparison: Previous exam(s).

CLINICAL DATA: Screening. History of breast reduction surgery.

EXAM:
DIGITAL SCREENING BILATERAL MAMMOGRAM WITH TOMO AND CAD

[R MLO synth-2D]
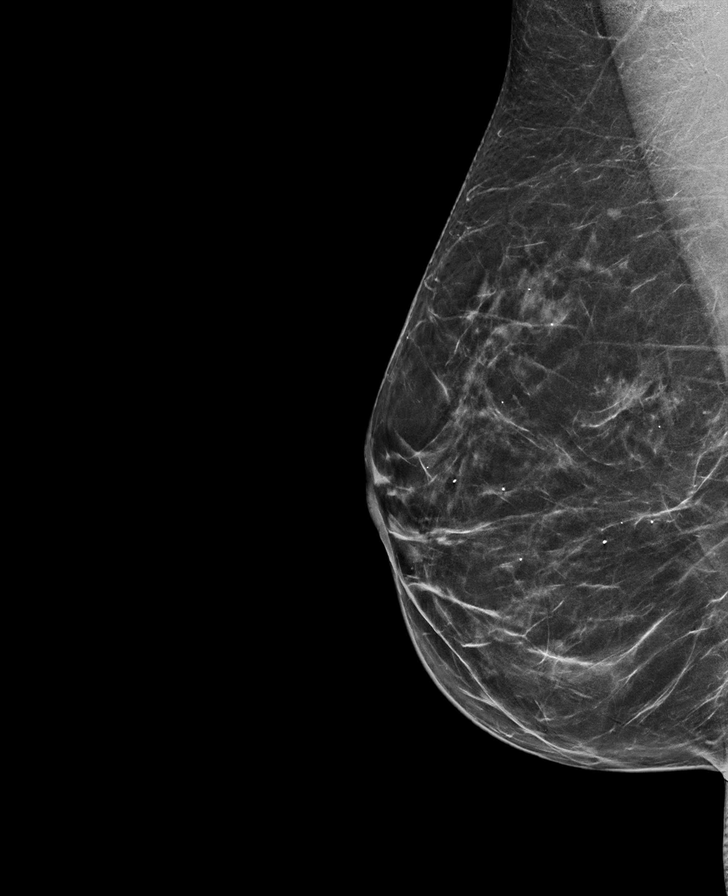

[R CC synth-2D]
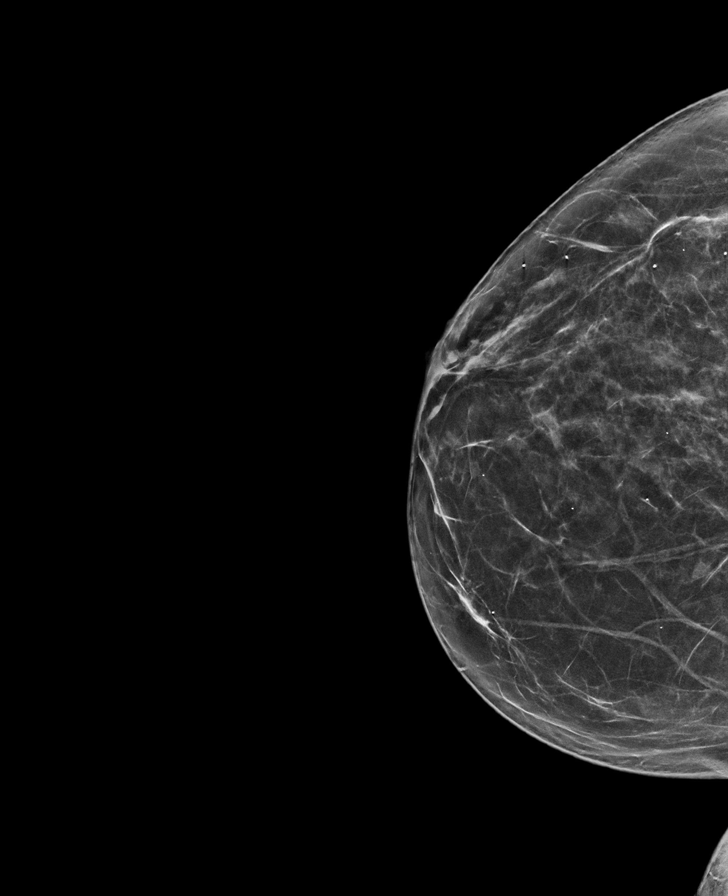

[L MLO synth-2D]
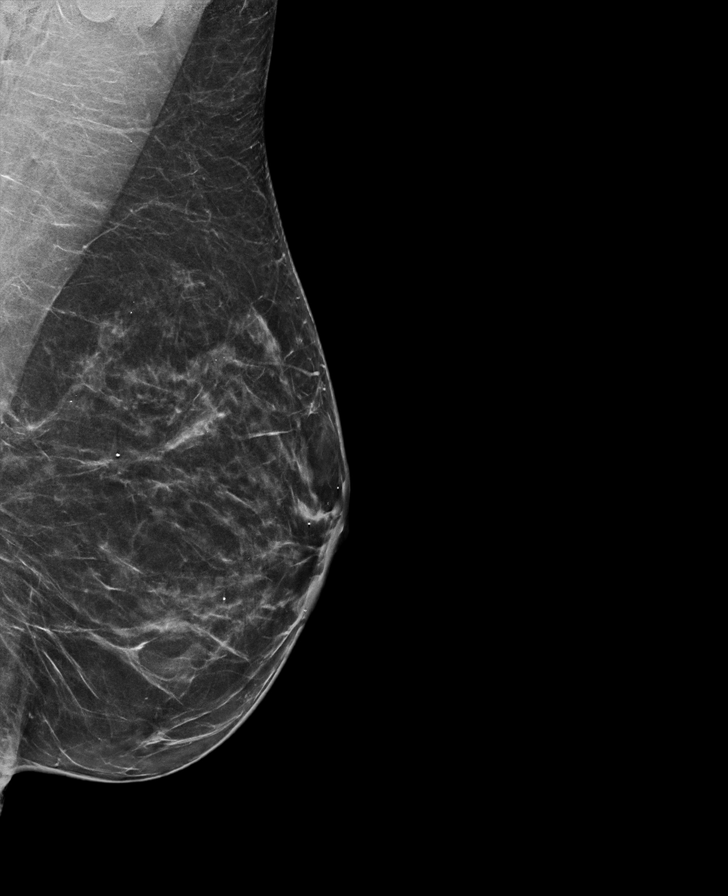

[L CC synth-2D]
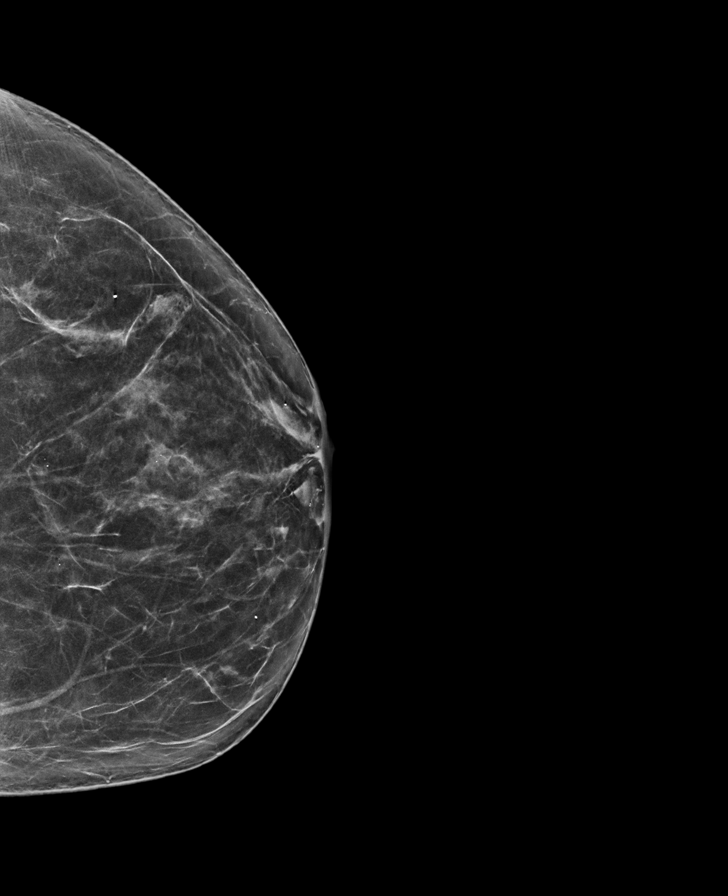

[R CC tomo · tomo slice 35/68.0]
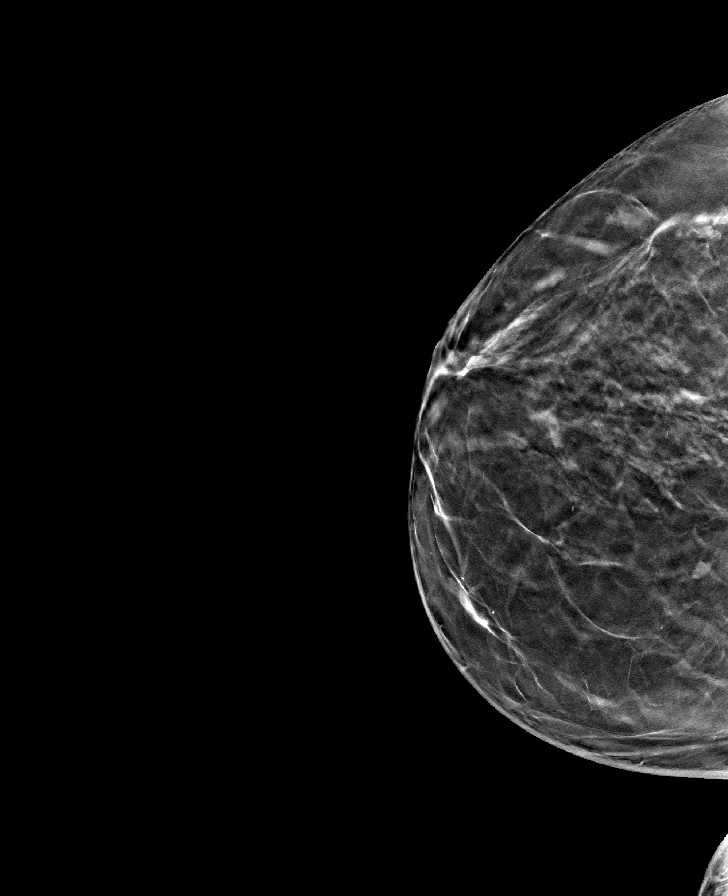

[L CC tomo · tomo slice 37/72.0]
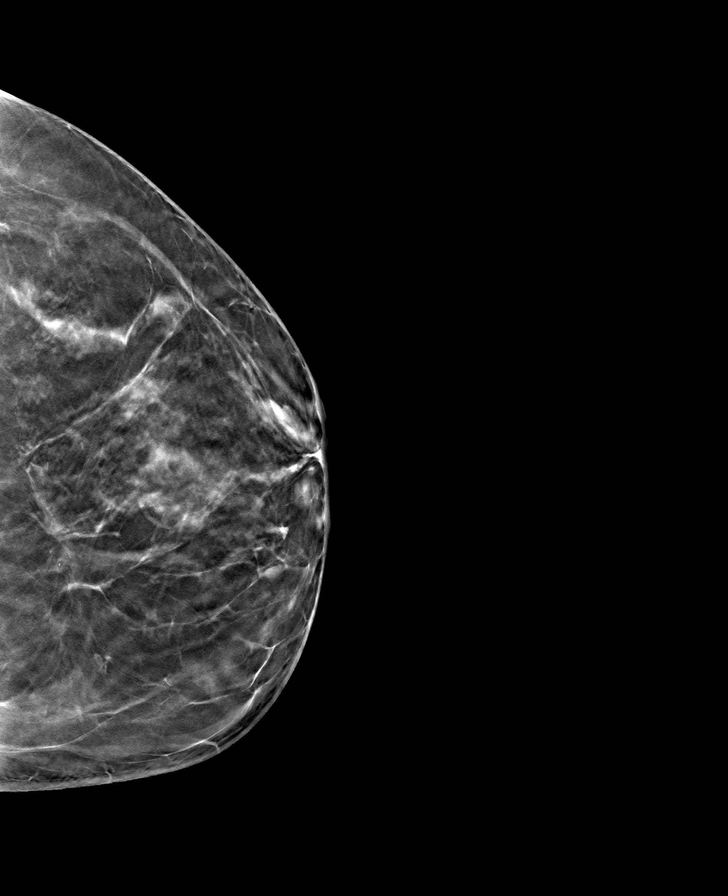

[R MLO tomo · tomo slice 35/69.0]
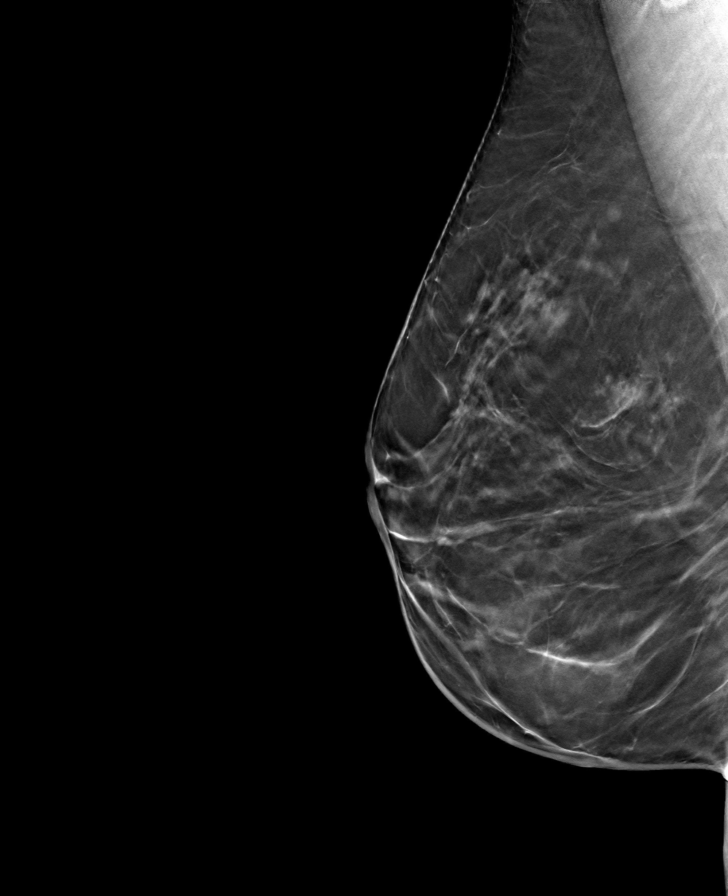

[L MLO tomo · tomo slice 35/70.0]
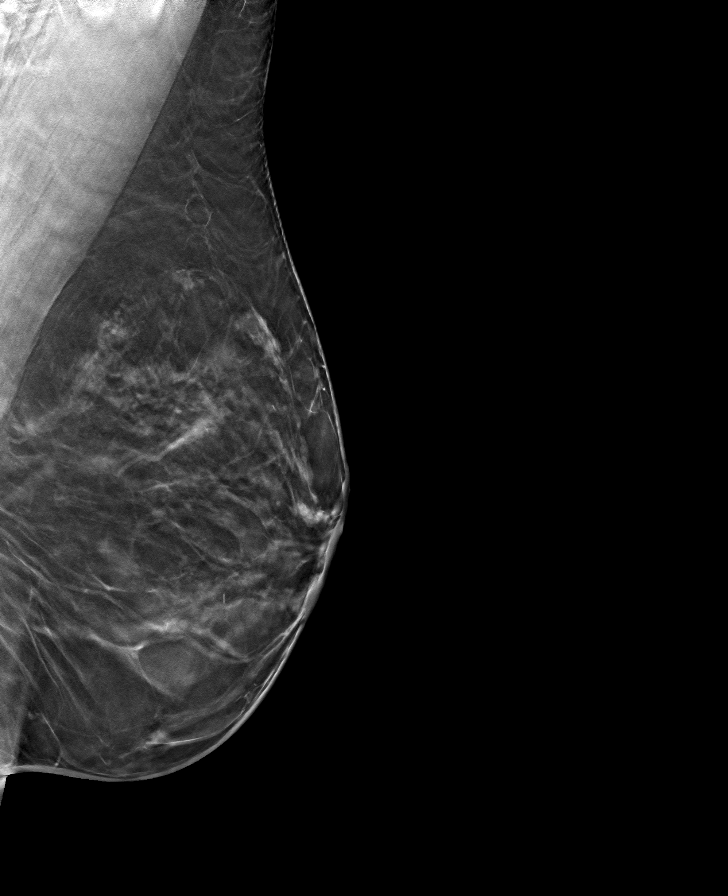

[8 of 24 positions shown; findings below may reference images not displayed]

ACR Breast Density Category b: There are scattered areas of
fibroglandular density.
FINDINGS: In the left breast, a possible asymmetry warrants further
evaluation. This possible asymmetry is seen within the central LEFT
breast, cc view only, slice 23.

In the right breast, no findings suspicious for malignancy. Images
were processed with CAD.
IMPRESSION: Further evaluation is suggested for possible asymmetry in the left
breast.

RECOMMENDATION:
Diagnostic mammogram and possibly ultrasound of the left breast.
(Code:[id number])

The patient will be contacted regarding the findings, and additional
imaging will be scheduled.

BI-RADS CATEGORY  0: Incomplete. Need additional imaging evaluation
and/or prior mammograms for comparison.

## 2022-07-15 ENCOUNTER — Other Ambulatory Visit: Payer: Self-pay | Admitting: Internal Medicine

## 2022-07-15 DIAGNOSIS — Z1231 Encounter for screening mammogram for malignant neoplasm of breast: Secondary | ICD-10-CM

## 2022-08-10 ENCOUNTER — Other Ambulatory Visit: Payer: Self-pay | Admitting: Internal Medicine

## 2022-08-10 DIAGNOSIS — R911 Solitary pulmonary nodule: Secondary | ICD-10-CM

## 2022-08-11 ENCOUNTER — Ambulatory Visit
Admission: RE | Admit: 2022-08-11 | Discharge: 2022-08-11 | Disposition: A | Discharge status: Home / Self Care | Payer: Managed Care, Other (non HMO) | Source: Ambulatory Visit | Attending: Internal Medicine | Admitting: Internal Medicine

## 2022-08-11 DIAGNOSIS — Z1231 Encounter for screening mammogram for malignant neoplasm of breast: Secondary | ICD-10-CM | POA: Insufficient documentation

## 2022-08-16 ENCOUNTER — Ambulatory Visit
Admission: RE | Admit: 2022-08-16 | Discharge: 2022-08-16 | Disposition: A | Discharge status: Home / Self Care | Payer: Managed Care, Other (non HMO) | Source: Ambulatory Visit | Attending: Internal Medicine | Admitting: Internal Medicine

## 2022-08-16 DIAGNOSIS — R911 Solitary pulmonary nodule: Secondary | ICD-10-CM

## 2022-08-16 MED ORDER — IOPAMIDOL (ISOVUE-300) INJECTION 61%
75.0000 mL | Freq: Once | INTRAVENOUS | Status: AC | PRN
  Administered 2022-08-16: 75 mL via INTRAVENOUS

## 2023-07-25 ENCOUNTER — Other Ambulatory Visit: Payer: Self-pay | Admitting: Internal Medicine

## 2023-07-25 DIAGNOSIS — Z1231 Encounter for screening mammogram for malignant neoplasm of breast: Secondary | ICD-10-CM

## 2023-08-17 ENCOUNTER — Encounter

## 2023-09-01 ENCOUNTER — Ambulatory Visit
Admission: RE | Admit: 2023-09-01 | Discharge: 2023-09-01 | Disposition: A | Discharge status: Home / Self Care | Source: Ambulatory Visit | Attending: Internal Medicine | Admitting: Internal Medicine

## 2023-09-01 DIAGNOSIS — Z1231 Encounter for screening mammogram for malignant neoplasm of breast: Secondary | ICD-10-CM | POA: Insufficient documentation

## 2024-01-11 ENCOUNTER — Ambulatory Visit

## 2024-01-11 DIAGNOSIS — L719 Rosacea, unspecified: Secondary | ICD-10-CM | POA: Diagnosis not present

## 2024-01-11 MED ORDER — DOXYCYCLINE 40 MG PO CPDR: DELAYED_RELEASE_CAPSULE | ORAL | 3 refills | Status: AC

## 2024-01-11 NOTE — Patient Instructions (Addendum)
 Skin Care for Rosacea  Rosacea may make your skin very sensitive and you may notice a breakout of acne-like bumps or redness when trying new creams or soaps on your skin or when eating certain foods.  The following the instructions will help minimize your breakouts and your doctor may provide medication instructions on this form.  Avoid triggers. These may include sunlight, stress, alcohol, spicy foods, heat and hot water. As each person is different, try to find your triggers and avoid them.   When trying new products, test for irritation by applying to the side of your neck instead of your face. Do not apply steroid creams to your face as it may lead to worsening. Less is more - avoid too many products on your face including harsh acne cleansers and fragranced moisturizers. Some suggested products include:   Cleansers:  avoid scrubs, loofah or rough hand clothes on your face If you have dry skin, only use water to rinse your face.  If you have oily areas or wear make-up, you may use a gentle cleanser once daily. Some recommendations include:  Cetaphil, Neutrogena cleanser, Clinique, Aveeno  Moisturizers:  Apply once or twice daily. Best if fragrance free, hypoallergenic, non-comedogenic (look on label).  Moisturizers with bland sunblocks (zinc oxide or titanium dioxide) are recommended. Some recommendations include:  Lotions: Cetaphil, Eucerin, Cerave, Neutrogena, Lubriderm, Curel, Aveeno. If you have a lot of redness: Green-tinted sunscreens and moisturizers from [employer] and others may be helpful   Due to recent changes in healthcare laws, you may see results of your pathology and/or laboratory studies on MyChart before the doctors have had a chance to review them. We understand that in some cases there may be results that are confusing or concerning to you. Please understand that not all results are received at the same time and often the doctors may need to interpret multiple  results in order to provide you with the best plan of care or course of treatment. Therefore, we ask that you please give us  2 business days to thoroughly review all your results before contacting the office for clarification. Should we see a critical lab result, you will be contacted sooner.   If You Need Anything After Your Visit  If you have any questions or concerns for your doctor, please call our main line at [phone] and press option 4 to reach your doctor's medical assistant. If no one answers, please leave a voicemail as directed and we will return your call as soon as possible. Messages left after 4 pm will be answered the following business day.   You may also send us  a message via MyChart. We typically respond to MyChart messages within 1-2 business days.  For prescription refills, please ask your pharmacy to contact our office. Our fax number is [phone].  If you have an urgent issue when the clinic is closed that cannot wait until the next business day, you can page your doctor at the number below.    Please note that while we do our best to be available for urgent issues outside of office hours, we are not available 24/7.   If you have an urgent issue and are unable to reach us , you may choose to seek medical care at your doctor's office, retail clinic, urgent care center, or emergency room.  If you have a medical emergency, please immediately call 911 or go to the emergency department.  Pager Numbers  - Dr. Hester: [phone]  - Dr. Jackquline: [phone]  -  Dr. Claudene: [phone]   - Dr. Raymund: [phone]  In the event of inclement weather, please call our main line at [phone] for an update on the status of any delays or closures.  Dermatology Medication Tips: Please keep the boxes that topical medications come in in order to help keep track of the instructions about where and how to use these. Pharmacies typically print the medication instructions  only on the boxes and not directly on the medication tubes.   If your medication is too expensive, please contact our office at [phone] option 4 or send us  a message through MyChart.   We are unable to tell what your co-pay for medications will be in advance as this is different depending on your insurance coverage. However, we may be able to find a substitute medication at lower cost or fill out paperwork to get insurance to cover a needed medication.   If a prior authorization is required to get your medication covered by your insurance company, please allow us  1-2 business days to complete this process.  Drug prices often vary depending on where the prescription is filled and some pharmacies may offer cheaper prices.  The website www.goodrx.com contains coupons for medications through different pharmacies. The prices here do not account for what the cost may be with help from insurance (it may be cheaper with your insurance), but the website can give you the price if you did not use any insurance.  - You can print the associated coupon and take it with your prescription to the pharmacy.  - You may also stop by our office during regular business hours and pick up a GoodRx coupon card.  - If you need your prescription sent electronically to a different pharmacy, notify our office through [facility] or by phone at [phone] option 4.     Si Usted Necesita Algo Despus de Su Visita  Tambin puede enviarnos un mensaje a travs de Clinical Cytogeneticist. Por lo general respondemos a los mensajes de MyChart en el transcurso de 1 a 2 das hbiles.  Para renovar recetas, por favor pida a su farmacia que se ponga en contacto con nuestra oficina. Randi lakes de fax es [place] [phone].  Si tiene un asunto urgente cuando la clnica est cerrada y que no puede esperar hasta el siguiente da hbil, puede llamar/localizar a su doctor(a) al nmero que aparece a continuacin.   Por favor, tenga en  cuenta que aunque hacemos todo lo posible para estar disponibles para asuntos urgentes fuera del horario de [place], no estamos disponibles las 24 horas del da, los 7 [address]  [address] de la [place].   Si tiene un problema urgente y no puede comunicarse con nosotros, puede optar por buscar atencin mdica  en el consultorio de su doctor(a), en una clnica privada, en un centro de atencin urgente o en una sala de emergencias.  Si tiene engineer, drilling, por favor llame inmediatamente al 911 o vaya a la sala de emergencias.  Nmeros de bper  - Dr. Hester: [phone]  - Dra. Jackquline: [phone]  - Dr. Claudene: [phone]  - Dra. Kitts: [phone]  En caso de inclemencias del [place], por favor llame a nuestra lnea principal al [phone] para una actualizacin sobre el estado de cualquier retraso o cierre.  Consejos para la medicacin en dermatologa: Por favor, guarde las cajas en las que vienen los medicamentos de uso tpico para ayudarle a seguir las instrucciones sobre dnde y cmo usarlos. Las farmacias generalmente imprimen las instrucciones del  medicamento slo en las cajas y no directamente en los tubos del medicamento.   Si su medicamento es muy caro, por favor, pngase en contacto con landry rieger llamando al [phone] y presione la opcin 4 o envenos un mensaje a travs de Clinical Cytogeneticist.   No podemos decirle cul ser su copago por los medicamentos por adelantado ya que esto es diferente dependiendo de la cobertura de su seguro. Sin embargo, es posible que podamos encontrar un medicamento sustituto a audiological scientist un formulario para que el seguro cubra el medicamento que se considera necesario.   Si se requiere una autorizacin previa para que su compaa de seguros cubra su medicamento, por favor permtanos de 1 a 2 das hbiles para completar este proceso.  Los precios de los medicamentos varan con frecuencia dependiendo del environmental consultant de dnde se surte la receta y alguna  farmacias pueden ofrecer precios ms baratos.  El sitio web www.goodrx.com tiene cupones para medicamentos de health and safety inspector. Los precios aqu no tienen en cuenta lo que podra costar con la ayuda del seguro (puede ser ms barato con su seguro), pero el sitio web puede darle el precio si no utiliz tourist information centre manager.  - Puede imprimir el cupn correspondiente y llevarlo con su receta a la farmacia.  - Tambin puede pasar por nuestra oficina durante el horario de atencin regular y education officer, museum una tarjeta de cupones de GoodRx.  - Si necesita que su receta se enve electrnicamente a una farmacia diferente, informe a nuestra oficina a travs de MyChart de [place] o por telfono llamando al [phone] y presione la opcin 4.

## 2024-01-11 NOTE — Progress Notes (Signed)
°  °  Subjective   Abigail Montes is a 43 y.o. female who presents for the following: Rash. Patient is new patient  Today patient reports: Patient states her cheeks, forehead have been getting really red, flaky and on neck has sometimes appeared dark brown. Has been occurring since the beginning of summer and cheeks within weeks. Products will irritate skin.   Review of Systems:    No other skin or systemic complaints except as noted in HPI or Assessment and Plan.  The following portions of the chart were reviewed this encounter and updated as appropriate: medications, allergies, medical history  Relevant Medical History:  Family history of skin cancer - SCC (grandmother)   Objective  (SKPE) Well appearing patient in no apparent distress; mood and affect are within normal limits. Examination was performed of the: Focused Exam of: face and chest   Examination notable for:   - Mild, diffuse erythema and telangiectasias involving the central face Examination limited by: Undergarments, Shoes or socks , and Clothing     Assessment & Plan  (SKAP)    Rosacea on the cheeks Chronic and persistent condition with duration or expected duration over one year. Condition is symptomatic and bothersome to patient. Patient is flaring and not currently at treatment goal.   - Reviewed etiology and probable recurrent nature of this condition - Discussed potential triggers including caffeine, heat, sun exposure, chocolate, etc. and recommended patient attempt to identify and avoid triggers - Start doxycycline  40 mg po once daily   - Start metrogel  - Discussed side effects and precautions with doxycycline  including taking with meal, waiting at least 30 minutes before lying down at night, increased sun sensitivity, and to stop medication if becomes pregnant or breastfeeding - Sun avoidance, protective clothing sunscreen discussed - Telangiectasias will likely not fade completely, laser removal may be  considered as a cosmetic procedure   Was sun protection counseling provided?: Yes   Level of service outlined above   Patient instructions (SKPI)   Procedures, orders, diagnosis for this visit:  ROSACEA    Rosacea  Other orders -     Doxycycline ; Take 1 capsule (40 mg total) po once daily  Dispense: 30 capsule; Refill: 3    Return to clinic: Return for 8-12 weeks , w/ Dr. Raymund.  I, Almetta Nora, RMA, am acting as scribe for Lauraine JAYSON Raymund, MD .   Documentation: I have reviewed the above documentation for accuracy and completeness, and I agree with the above.  Lauraine JAYSON Raymund, MD

## 2024-03-07 ENCOUNTER — Ambulatory Visit
